# Patient Record
Sex: Female | Born: 1960 | Race: Black or African American | Hispanic: No | Marital: Single | Smoking: Never smoker
Health system: Southern US, Community
[De-identification: ages and names within clinical notes are randomized; demographics above are authoritative.]

## PROBLEM LIST (undated history)

## (undated) DIAGNOSIS — L730 Acne keloid: Secondary | ICD-10-CM

## (undated) DIAGNOSIS — J189 Pneumonia, unspecified organism: Secondary | ICD-10-CM

## (undated) DIAGNOSIS — I10 Essential (primary) hypertension: Secondary | ICD-10-CM

## (undated) DIAGNOSIS — E119 Type 2 diabetes mellitus without complications: Secondary | ICD-10-CM

## (undated) HISTORY — DX: Type 2 diabetes mellitus without complications: E11.9

## (undated) HISTORY — DX: Essential (primary) hypertension: I10

## (undated) HISTORY — DX: Pneumonia, unspecified organism: J18.9

---

## 1998-01-24 HISTORY — PX: OTHER SURGICAL HISTORY: SHX169

## 2004-06-30 ENCOUNTER — Ambulatory Visit: Payer: Self-pay | Admitting: Internal Medicine

## 2005-08-11 ENCOUNTER — Ambulatory Visit: Payer: Self-pay | Admitting: General Practice

## 2005-08-22 ENCOUNTER — Ambulatory Visit: Payer: Self-pay | Admitting: Internal Medicine

## 2010-04-06 ENCOUNTER — Emergency Department: Payer: Self-pay | Admitting: Unknown Physician Specialty

## 2011-05-05 LAB — HM DIABETES EYE EXAM

## 2011-10-22 ENCOUNTER — Emergency Department: Payer: Self-pay | Admitting: Emergency Medicine

## 2012-02-17 ENCOUNTER — Emergency Department: Payer: Self-pay | Admitting: Emergency Medicine

## 2012-02-17 LAB — CBC WITH DIFFERENTIAL/PLATELET
Basophil #: 0.1 10*3/uL (ref 0.0–0.1)
Eosinophil #: 0.1 10*3/uL (ref 0.0–0.7)
Eosinophil %: 2.6 %
HCT: 43 % (ref 35.0–47.0)
Lymphocyte #: 1.5 10*3/uL (ref 1.0–3.6)
MCV: 75 fL — ABNORMAL LOW (ref 80–100)
Monocyte %: 8.4 %
Neutrophil #: 3.2 10*3/uL (ref 1.4–6.5)
Platelet: 244 10*3/uL (ref 150–440)
RBC: 5.73 10*6/uL — ABNORMAL HIGH (ref 3.80–5.20)

## 2012-02-17 LAB — URINALYSIS, COMPLETE
Glucose,UR: >=500 mg/dL (ref 0–75)
Leukocyte Esterase: NEGATIVE
Nitrite: NEGATIVE
RBC,UR: <1 /HPF (ref 0–5)
Specific Gravity: 1.027 (ref 1.003–1.030)
Squamous Epithelial: 1
WBC UR: 5 /HPF (ref 0–5)

## 2012-02-17 LAB — COMPREHENSIVE METABOLIC PANEL
Anion Gap: 7 (ref 7–16)
BUN: 16 mg/dL (ref 7–18)
Calcium, Total: 9.9 mg/dL (ref 8.5–10.1)
Creatinine: 1.06 mg/dL (ref 0.60–1.30)
EGFR (Non-African Amer.): >60
Glucose: 488 mg/dL — ABNORMAL HIGH (ref 65–99)
Osmolality: 285 (ref 275–301)
Potassium: 5 mmol/L (ref 3.5–5.1)
SGOT(AST): 30 U/L (ref 15–37)
SGPT (ALT): 42 U/L (ref 12–78)
Sodium: 131 mmol/L — ABNORMAL LOW (ref 136–145)
Total Protein: 8 g/dL (ref 6.4–8.2)

## 2012-05-10 LAB — HM DIABETES EYE EXAM

## 2012-07-26 LAB — HM DIABETES EYE EXAM

## 2013-12-13 ENCOUNTER — Ambulatory Visit: Payer: Self-pay | Admitting: Nurse Practitioner

## 2014-05-16 ENCOUNTER — Observation Stay
Admit: 2014-05-16 | Disposition: A | Discharge status: Home / Self Care | Payer: Self-pay | Attending: Internal Medicine | Admitting: Internal Medicine

## 2014-05-16 LAB — CBC WITH DIFFERENTIAL/PLATELET
Basophil #: 0.1 10*3/uL (ref 0.0–0.1)
Basophil %: 0.9 %
EOS PCT: 1.6 %
Eosinophil #: 0.1 10*3/uL (ref 0.0–0.7)
HCT: 36.6 % (ref 35.0–47.0)
HGB: 12 g/dL (ref 12.0–16.0)
LYMPHS PCT: 17.1 %
Lymphocyte #: 1.4 10*3/uL (ref 1.0–3.6)
MCH: 24.9 pg — ABNORMAL LOW (ref 26.0–34.0)
MCHC: 32.8 g/dL (ref 32.0–36.0)
MCV: 76 fL — ABNORMAL LOW (ref 80–100)
MONO ABS: 0.6 x10 3/mm (ref 0.2–0.9)
Monocyte %: 7.6 %
NEUTROS ABS: 6.1 10*3/uL (ref 1.4–6.5)
NEUTROS PCT: 72.8 %
Platelet: 218 10*3/uL (ref 150–440)
RBC: 4.83 10*6/uL (ref 3.80–5.20)
RDW: 13 % (ref 11.5–14.5)
WBC: 8.4 10*3/uL (ref 3.6–11.0)

## 2014-05-16 LAB — CBC
HCT: 42.3 % (ref 35.0–47.0)
HGB: 13.8 g/dL (ref 12.0–16.0)
MCH: 24.7 pg — ABNORMAL LOW (ref 26.0–34.0)
MCHC: 32.7 g/dL (ref 32.0–36.0)
MCV: 76 fL — AB (ref 80–100)
Platelet: 272 10*3/uL (ref 150–440)
RBC: 5.59 10*6/uL — ABNORMAL HIGH (ref 3.80–5.20)
RDW: 13.1 % (ref 11.5–14.5)
WBC: 6.9 10*3/uL (ref 3.6–11.0)

## 2014-05-16 LAB — BASIC METABOLIC PANEL
ANION GAP: 10 (ref 7–16)
BUN: 19 mg/dL
CO2: 28 mmol/L
CREATININE: 0.93 mg/dL
Calcium, Total: 9.9 mg/dL
Chloride: 101 mmol/L
EGFR (African American): >60
EGFR (Non-African Amer.): >60
Glucose: 205 mg/dL — ABNORMAL HIGH
POTASSIUM: 3.4 mmol/L — AB
Sodium: 139 mmol/L

## 2014-05-16 LAB — TROPONIN I: Troponin-I: <0.03 ng/mL

## 2014-05-17 LAB — CBC WITH DIFFERENTIAL/PLATELET
BASOS PCT: 1.3 %
Basophil #: 0.1 10*3/uL (ref 0.0–0.1)
Eosinophil #: 0.1 10*3/uL (ref 0.0–0.7)
Eosinophil %: 1.6 %
HCT: 33.6 % — ABNORMAL LOW (ref 35.0–47.0)
HGB: 11.5 g/dL — AB (ref 12.0–16.0)
Lymphocyte #: 1.2 10*3/uL (ref 1.0–3.6)
Lymphocyte %: 17 %
MCH: 25.7 pg — ABNORMAL LOW (ref 26.0–34.0)
MCHC: 34.1 g/dL (ref 32.0–36.0)
MCV: 75 fL — ABNORMAL LOW (ref 80–100)
Monocyte #: 0.4 x10 3/mm (ref 0.2–0.9)
Monocyte %: 6.4 %
NEUTROS ABS: 5.2 10*3/uL (ref 1.4–6.5)
NEUTROS PCT: 73.7 %
Platelet: 208 10*3/uL (ref 150–440)
RBC: 4.46 10*6/uL (ref 3.80–5.20)
RDW: 12.8 % (ref 11.5–14.5)
WBC: 7 10*3/uL (ref 3.6–11.0)

## 2014-05-17 LAB — INFLUENZA A,B,H1N1 - PCR (ARMC)
H1N1 flu by pcr: NOT DETECTED
INFLBPCR: NEGATIVE
Influenza A By PCR: NEGATIVE

## 2014-05-17 LAB — BASIC METABOLIC PANEL
Anion Gap: 7 (ref 7–16)
BUN: 18 mg/dL
Calcium, Total: 8.7 mg/dL — ABNORMAL LOW
Chloride: 100 mmol/L — ABNORMAL LOW
Co2: 27 mmol/L
Creatinine: 0.89 mg/dL
EGFR (African American): >60
EGFR (Non-African Amer.): >60
Glucose: 284 mg/dL — ABNORMAL HIGH
Potassium: 3.9 mmol/L
Sodium: 134 mmol/L — ABNORMAL LOW

## 2014-05-17 LAB — HEMOGLOBIN: HGB: 11.5 g/dL — AB (ref 12.0–16.0)

## 2014-05-18 LAB — CBC WITH DIFFERENTIAL/PLATELET
BASOS ABS: 0.1 10*3/uL (ref 0.0–0.1)
Basophil %: 1 %
Eosinophil #: 0.3 10*3/uL (ref 0.0–0.7)
Eosinophil %: 5.1 %
HCT: 36.1 % (ref 35.0–47.0)
HGB: 11.7 g/dL — ABNORMAL LOW (ref 12.0–16.0)
Lymphocyte #: 1.5 10*3/uL (ref 1.0–3.6)
Lymphocyte %: 27.8 %
MCH: 24.5 pg — ABNORMAL LOW (ref 26.0–34.0)
MCHC: 32.4 g/dL (ref 32.0–36.0)
MCV: 76 fL — ABNORMAL LOW (ref 80–100)
MONOS PCT: 9.3 %
Monocyte #: 0.5 x10 3/mm (ref 0.2–0.9)
NEUTROS ABS: 3 10*3/uL (ref 1.4–6.5)
Neutrophil %: 56.8 %
PLATELETS: 239 10*3/uL (ref 150–440)
RBC: 4.79 10*6/uL (ref 3.80–5.20)
RDW: 12.8 % (ref 11.5–14.5)
WBC: 5.2 10*3/uL (ref 3.6–11.0)

## 2014-05-20 LAB — EXPECTORATED SPUTUM ASSESSMENT W REFEX TO RESP CULTURE

## 2014-05-21 LAB — CULTURE, BLOOD (SINGLE)

## 2014-05-25 DIAGNOSIS — J189 Pneumonia, unspecified organism: Secondary | ICD-10-CM

## 2014-05-25 HISTORY — DX: Pneumonia, unspecified organism: J18.9

## 2014-05-25 NOTE — Discharge Summary (Signed)
PATIENT NAME:  Cynthia Harper, BUFFONE MR#:  [id number] DATE OF BIRTH:  06-06-1960  DATE OF ADMISSION:  05/16/2014 DATE OF DISCHARGE:  05/18/2014  PRIMARY CARE PHYSICIAN:  [facility].  DISCHARGE DIAGNOSES:   Community-acquired pneumonia, right lower lobe; hemoptysis; hypertension; diabetes; hyperlipidemia.   CONDITION:  Stable.   CODE STATUS:  Full code.   HOME MEDICATIONS:  Please refer to the medication reconciliation list.  Aspirin was discontinued due to hemoptysis.   DIET:  Low-sodium, low-fat, low-cholesterol, ADA diet.   ACTIVITY:  As tolerated.   FOLLOWUP CARE:  Follow up with PCP within 1-2 weeks.   REASON FOR ADMISSION:  Coughing blood.   HOSPITAL COURSE:   1. The patient is a 54 year old African American female with a history of hypertension and diabetes.  Came to ED due to coughing blood with fever, chills for 5 days.  For detailed history and physical examination, please refer to the admission note dictated by Dr. Lavetta Nielsen.  Patient had a chest x-ray that showed right lower lobe pneumonia.  After admission, the patient has been treated with Levaquin IV.  Since patient had a lot of hemoptysis on the second day after admission, we monitored the patient's hemoglobin every 6 hours.  Patient's hemoglobin has been stable.  The last hemoglobin is 11.7 today.  The patient has no hemoptysis since yesterday afternoon.  Her symptoms are much better.  No fever or chills.  Vital signs are stable.  Physical examination is unremarkable.  2. For diabetes, the patient has been treated with sliding scale Lantus at her home dose.  3. Hypertension has been controlled.   The patient is clinically stable.  Will be discharged to home today.  Discussed the patient's discharge plan with the patient, the patient's family member, and nurse.   TIME SPENT:  About 36 minutes.    ____________________________ Demetrios Loll, MD qc:kc D: 05/18/2014 14:00:25 ET T: 05/18/2014 15:40:00  ET JOB#: [id number]  cc: Demetrios Loll, MD, <Dictator> Demetrios Loll MD ELECTRONICALLY SIGNED 05/19/2014 17:15

## 2014-05-25 NOTE — H&P (Signed)
PATIENT NAME:  Cynthia Harper, Cynthia Harper MR#:  [id number] DATE OF BIRTH:  Aug 04, 1960  DATE OF ADMISSION:  05/16/2014  REFERRING PHYSICIAN: Wells Guiles L. Reita Cliche, MD  PRIMARY CARE PHYSICIAN: [facility].  CHIEF COMPLAINT: Coughing up blood.   HISTORY OF PRESENT ILLNESS: A 54 year old [place] American female with a past medical history of type 2 diabetes, insulin-requiring; hypertension, essential; presenting with coughing up blood/hemoptysis. She describes a 5-day duration of cough with subjective fevers, chills, which turned into hemoptysis about 3 days ago of sputum streaked with blood to occasional frank blood clots. Saw PCP 3 days ago about worsening symptoms. She was diagnosed with "flu;" however, had no influenza swab that was performed. Given some cough medications with codeine, sent home. Given worsening symptoms, decided to present to the hospital for further workup and evaluation.  REVIEW OF SYSTEMS:  CONSTITUTIONAL: Positive for fevers, chills. Denies fatigue, weakness.  EYES: Denies blurred vision, double vision, eye pain. EARS, NOSE, THROAT: Denies tinnitus, ear pain, hearing loss. RESPIRATORY: Positive for cough and hemoptysis.  CARDIOVASCULAR: Denies chest pain, palpitations, edema.  GASTROINTESTINAL: Denies nausea, vomiting, diarrhea, or abdominal pain. GENITOURINARY: Denies dysuria or hematuria. ENDOCRINE: Denies nocturia or thyroid problems. HEMATOLOGIC AND LYMPHATIC: Denies easy bruising. Positive for bleeding as above. SKIN: Denies rash, lesion.  MUSCULOSKELETAL: Denies pain in neck, back, shoulder, knees, hips or arthritic symptoms.  NEUROLOGIC: Denies paralysis, paresthesias.  PSYCHIATRIC: Denies anxiety or depressive symptoms.  Otherwise, full review of systems performed by me was negative.   PAST MEDICAL HISTORY: Includes hypertension, essential; hyperlipidemia, unspecified; type 2 diabetes, insulin-requiring, however uncomplicated.   SOCIAL HISTORY: Denies any alcohol,  tobacco, or drug use.   FAMILY HISTORY: Positive for diabetes in multiple family members.   ALLERGIES: ASPIRIN.   HOME MEDICATIONS: Include aspirin 81 mg p.o. daily, lisinopril 20 mg p.o. b.i.d., Levemir 45 units b.i.d., metformin 500 mg p.o. b.i.d., NovoLog 5 units 3 times daily with meals, Tradjenta 5 mg p.o. daily, simvastatin 5 mg p.o. at bedtime, Norvasc 5 mg p.o. daily, hydrochlorothiazide 25 mg p.o. daily.   PHYSICAL EXAMINATION:  VITAL SIGNS: Temperature 98.2; heart rate on arrival 125, currently 96; respirations 24, currently 16; blood pressure 130/84; saturating 92% on room air; weight 86.2 kg.  GENERAL: Well-nourished, well-developed, African American female currently in no acute distress.  HEAD: Normocephalic, atraumatic.  EYES: Pupils equal, round, and reactive to light. Extraocular muscles intact. No scleral icterus.  MOUTH: Moist mucosal membranes. Dentition intact. No abscess noted.  EARS, NOSE, AND THROAT: Clear without exudates. No external lesions.  NECK: Supple. No thyromegaly. No nodules. No JVD.  PULMONARY: Greatly diminished breath sounds throughout all lung fields with some coarse rhonchi of right base. No wheezing noted. Poor air entry bilaterally.  CHEST: Nontender to palpation.  CARDIOVASCULAR: S1, S2, regular rate and rhythm. No murmurs, rubs, or gallops. No edema. Pedal pulses 2+ bilaterally. GASTROINTESTINAL: Soft, nontender, nondistended. No masses. Positive bowel sounds. No hepatosplenomegaly.  MUSCULOSKELETAL: No swelling, clubbing, or edema. Range of motion full in all extremities.  NEUROLOGIC: Cranial nerves II through XII intact. No gross focal neurologic deficits. Sensation intact. Reflexes intact.  SKIN: No ulceration, lesions, rashes, or cyanosis. Skin warm, dry. Turgor intact.  PSYCHIATRIC: Mood, affect are within normal limits. The patient is awake, alert, oriented x 3. Insight and judgment intact.   LABORATORY DATA: CT, chest performed. No evidence  of PE; however, there is some consolidation in the right lower lobe consistent with pneumonia. Remainder of laboratory data: Sodium 139, potassium 3.4, chloride  101, bicarbonate of 28, BUN 19, creatinine 0.93, glucose of 205. WBC of 6.9, hemoglobin 13.8, platelets of 272,000.   ASSESSMENT AND PLAN: A 54 year old [place] American female with a history of type 2 diabetes, insulin-requiring; hypertension, essential; presenting with hemoptysis.  1.  Community-acquired pneumonia. She has already received antibiotic coverage with Levaquin; we will continue this. Provide supplemental oxygen as required to keep oxygen saturation greater than 92%. DuoNeb treatments as well. Cultures were performed for culture data.  2.  Hemoptysis secondary to community-acquired pneumonia and coughing. Monitor CBCs every 6 hours. We will consult pulmonary critical-care medicine, as she may require bronchoscopy if condition worsens. No acute indication for one immediately now.  3.  Type 2 diabetes, insulin-requiring, uncomplicated. Hold p.o. agents. Add insulin sliding scale, q.6 hour Accu-Cheks. Continue with her basal Levemir. 4.  Hypertension, essential. Continue with Norvasc.  5.  Hyperlipidemia, unspecified. Continue statin therapy.  6.  Venous thromboembolism prophylaxis with sequential compression devices.  CODE STATUS: The patient is full code.   TIME SPENT: 35 minutes.   ____________________________ Aaron Mose. Hower, MD dkh:ST D: 05/16/2014 21:12:53 ET T: 05/16/2014 23:13:12 ET JOB#: [id number]  cc: Aaron Mose. Hower, MD, <Dictator> DAVID Woodfin Ganja MD ELECTRONICALLY SIGNED 05/18/2014 2:52

## 2014-07-17 ENCOUNTER — Other Ambulatory Visit: Payer: Self-pay | Admitting: Physician Assistant

## 2014-07-17 DIAGNOSIS — R011 Cardiac murmur, unspecified: Secondary | ICD-10-CM

## 2014-07-23 ENCOUNTER — Ambulatory Visit: Payer: Self-pay | Attending: Physician Assistant

## 2014-07-31 ENCOUNTER — Ambulatory Visit
Admission: RE | Admit: 2014-07-31 | Discharge: 2014-07-31 | Disposition: A | Discharge status: Home / Self Care | Payer: Managed Care, Other (non HMO) | Source: Ambulatory Visit | Attending: Physician Assistant | Admitting: Physician Assistant

## 2014-07-31 DIAGNOSIS — R011 Cardiac murmur, unspecified: Secondary | ICD-10-CM | POA: Diagnosis present

## 2014-07-31 NOTE — Progress Notes (Signed)
*  PRELIMINARY RESULTS* Echocardiogram 2D Echocardiogram has been performed.  Cynthia Harper 07/31/2014, 12:00 PM

## 2014-11-18 ENCOUNTER — Other Ambulatory Visit: Payer: Self-pay

## 2014-11-25 ENCOUNTER — Ambulatory Visit: Payer: Self-pay

## 2014-12-03 ENCOUNTER — Institutional Professional Consult (permissible substitution): Payer: Self-pay | Admitting: Specialist

## 2014-12-04 ENCOUNTER — Ambulatory Visit: Payer: Self-pay | Admitting: Ophthalmology

## 2014-12-11 ENCOUNTER — Ambulatory Visit: Payer: Self-pay | Admitting: Ophthalmology

## 2014-12-16 ENCOUNTER — Other Ambulatory Visit: Payer: Self-pay

## 2014-12-16 LAB — BASIC METABOLIC PANEL
BUN: 15 mg/dL (ref 4–21)
CREATININE: 0.8 mg/dL (ref 0.5–1.1)
Glucose: 271 mg/dL
POTASSIUM: 4.5 mmol/L (ref 3.4–5.3)
Sodium: 137 mmol/L (ref 137–147)

## 2014-12-16 LAB — LIPID PANEL
Cholesterol: 323 mg/dL — AB (ref 0–200)
HDL: 47 mg/dL (ref 35–70)
TRIGLYCERIDES: 454 mg/dL — AB (ref 40–160)

## 2014-12-16 LAB — TSH: TSH: 2.56 u[IU]/mL (ref 0.41–5.90)

## 2014-12-16 LAB — HEPATIC FUNCTION PANEL
ALK PHOS: 101 U/L (ref 25–125)
ALT: 36 U/L — AB (ref 7–35)
AST: 19 U/L (ref 13–35)
BILIRUBIN, TOTAL: 0.2 mg/dL

## 2014-12-16 LAB — CBC AND DIFFERENTIAL
HEMATOCRIT: 36 % (ref 36–46)
HEMOGLOBIN: 12 g/dL (ref 12.0–16.0)
Neutrophils Absolute: 3 /uL
Platelets: 242 10*3/uL (ref 150–399)
WBC: 5.7 10*3/mL

## 2014-12-16 LAB — HEMOGLOBIN A1C: HEMOGLOBIN A1C: 12.9

## 2014-12-24 ENCOUNTER — Institutional Professional Consult (permissible substitution): Payer: Self-pay | Admitting: Specialist

## 2014-12-24 ENCOUNTER — Ambulatory Visit
Admission: RE | Admit: 2014-12-24 | Discharge: 2014-12-24 | Disposition: A | Discharge status: Home / Self Care | Payer: Managed Care, Other (non HMO) | Source: Ambulatory Visit | Attending: Specialist | Admitting: Specialist

## 2014-12-24 ENCOUNTER — Other Ambulatory Visit: Payer: Self-pay | Admitting: Specialist

## 2014-12-24 ENCOUNTER — Ambulatory Visit
Admission: RE | Admit: 2014-12-24 | Discharge: 2014-12-24 | Disposition: A | Discharge status: Home / Self Care | Payer: Self-pay | Source: Ambulatory Visit | Attending: Specialist | Admitting: Specialist

## 2014-12-24 DIAGNOSIS — M5440 Lumbago with sciatica, unspecified side: Secondary | ICD-10-CM

## 2014-12-24 DIAGNOSIS — I70208 Unspecified atherosclerosis of native arteries of extremities, other extremity: Secondary | ICD-10-CM | POA: Insufficient documentation

## 2014-12-24 DIAGNOSIS — M25552 Pain in left hip: Secondary | ICD-10-CM

## 2014-12-24 DIAGNOSIS — M545 Low back pain: Secondary | ICD-10-CM | POA: Insufficient documentation

## 2014-12-25 ENCOUNTER — Ambulatory Visit: Payer: Self-pay | Admitting: Ophthalmology

## 2014-12-25 LAB — HM DIABETES EYE EXAM

## 2014-12-30 ENCOUNTER — Ambulatory Visit: Payer: Self-pay

## 2014-12-31 ENCOUNTER — Ambulatory Visit: Payer: Self-pay | Admitting: Specialist

## 2015-01-14 ENCOUNTER — Ambulatory Visit: Payer: Self-pay | Admitting: Specialist

## 2015-01-21 ENCOUNTER — Ambulatory Visit: Payer: Self-pay | Admitting: Specialist

## 2015-01-28 ENCOUNTER — Other Ambulatory Visit: Payer: Self-pay | Admitting: Specialist

## 2015-01-28 DIAGNOSIS — M5416 Radiculopathy, lumbar region: Secondary | ICD-10-CM

## 2015-02-10 ENCOUNTER — Ambulatory Visit: Payer: Self-pay

## 2015-02-18 ENCOUNTER — Ambulatory Visit
Admission: RE | Admit: 2015-02-18 | Discharge: 2015-02-18 | Disposition: A | Discharge status: Home / Self Care | Payer: Managed Care, Other (non HMO) | Source: Ambulatory Visit | Attending: Specialist | Admitting: Specialist

## 2015-02-18 DIAGNOSIS — D259 Leiomyoma of uterus, unspecified: Secondary | ICD-10-CM | POA: Insufficient documentation

## 2015-02-18 DIAGNOSIS — M5136 Other intervertebral disc degeneration, lumbar region: Secondary | ICD-10-CM | POA: Insufficient documentation

## 2015-02-18 DIAGNOSIS — M545 Low back pain: Secondary | ICD-10-CM | POA: Insufficient documentation

## 2015-02-18 DIAGNOSIS — M5416 Radiculopathy, lumbar region: Secondary | ICD-10-CM | POA: Insufficient documentation

## 2015-02-18 MED ORDER — GADOBENATE DIMEGLUMINE 529 MG/ML IV SOLN
20.0000 mL | Freq: Once | INTRAVENOUS | Status: AC | PRN
  Administered 2015-02-18: 18 mL via INTRAVENOUS

## 2015-02-20 LAB — POCT I-STAT CREATININE: Creatinine, Ser: 0.9 mg/dL (ref 0.44–1.00)

## 2015-02-20 LAB — BASIC METABOLIC PANEL: CREATININE: 0.9 mg/dL (ref <=1.1)

## 2015-02-25 ENCOUNTER — Ambulatory Visit: Payer: Self-pay | Admitting: Specialist

## 2015-03-10 DIAGNOSIS — M549 Dorsalgia, unspecified: Secondary | ICD-10-CM | POA: Insufficient documentation

## 2015-03-10 DIAGNOSIS — I10 Essential (primary) hypertension: Secondary | ICD-10-CM

## 2015-03-10 DIAGNOSIS — E119 Type 2 diabetes mellitus without complications: Secondary | ICD-10-CM | POA: Insufficient documentation

## 2015-03-10 DIAGNOSIS — M545 Low back pain: Secondary | ICD-10-CM

## 2015-03-31 ENCOUNTER — Encounter: Payer: Self-pay | Admitting: Internal Medicine

## 2015-03-31 ENCOUNTER — Ambulatory Visit: Payer: Self-pay | Admitting: Internal Medicine

## 2015-03-31 VITALS — BP 135/90 | HR 93 | Wt 194.0 lb

## 2015-03-31 DIAGNOSIS — E785 Hyperlipidemia, unspecified: Secondary | ICD-10-CM

## 2015-03-31 DIAGNOSIS — E119 Type 2 diabetes mellitus without complications: Secondary | ICD-10-CM

## 2015-03-31 DIAGNOSIS — I10 Essential (primary) hypertension: Secondary | ICD-10-CM

## 2015-03-31 DIAGNOSIS — R1900 Intra-abdominal and pelvic swelling, mass and lump, unspecified site: Secondary | ICD-10-CM

## 2015-03-31 DIAGNOSIS — M545 Low back pain: Secondary | ICD-10-CM

## 2015-03-31 MED ORDER — INSULIN ASPART 100 UNIT/ML FLEXPEN: 12.0000 [IU] | PEN_INJECTOR | Freq: Three times a day (TID) | SUBCUTANEOUS | Status: DC

## 2015-03-31 NOTE — Assessment & Plan Note (Signed)
Pelvic mass seen on MRI. Palpable mass on exam. Will get CT abdomen for better evaluation. Set up GYN evaluation.

## 2015-03-31 NOTE — Patient Instructions (Addendum)
It was great to see you tonight!  Labs today to check A1c.  We will set up a CT abdomen to evaluate your pelvic mass seen on MRI.  We will set up gynecology evaluation.  Follow up 4 weeks.

## 2015-03-31 NOTE — Assessment & Plan Note (Signed)
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 Reviewed MRI lumbar spine. Continue Mobic. Follow up with ortho.

## 2015-03-31 NOTE — Progress Notes (Signed)
Subjective:    Patient ID: Quay Burow, female    DOB: 02/28/1960, 55 y.o.   MRN: [id number]  HPI  55YO female presents for follow up.  Back pain - Aching pain with left sciatica. Seen by ortho. Had MRI lumbar spine which showed L4-5 DDD. Recommended PT. Taking Mobic with minimal improvement.  Pelvic mass - Seen on MRI lumbar spine. No menstrual bleeding for 1 year. Periods were very heavy in past. Had fibroidectomy at [facility] in 2002. Notes some constipation recently. Taking Laxatives at home. Last Colonoscopy in 1980s.   DM - BG over 400 here today. Reports compliance with medication. Trying to follow a healthy diet.  Wt Readings from Last 3 Encounters:  03/31/15 194 lb (87.998 kg)  12/30/14 196 lb (88.905 kg)  02/18/15 194 lb (87.998 kg)   BP Readings from Last 3 Encounters:  03/31/15 135/90  12/30/14 139/88    Past Medical History  Diagnosis Date  . Hypertension   . Diabetes mellitus without complication ([place])   . Pneumonia May 2016    Pt reports being hospitalized for pneumonia in May 2016.   History reviewed. No pertinent family history. Past Surgical History  Procedure Laterality Date  . Fibroid tumor removal  2000   Social History   Social History  . Marital Status: Single    Spouse Name: N/A  . Number of Children: N/A  . Years of Education: N/A   Social History Main Topics  . Smoking status: None  . Smokeless tobacco: None  . Alcohol Use: None  . Drug Use: None  . Sexual Activity: Not Asked   Other Topics Concern  . None   Social History Narrative    Review of Systems  Constitutional: Negative for fever, chills, appetite change, fatigue and unexpected weight change.  Eyes: Negative for visual disturbance.  Respiratory: Negative for shortness of breath.   Cardiovascular: Negative for chest pain and leg swelling.  Gastrointestinal: Negative for nausea, vomiting, abdominal pain, diarrhea and constipation.  Genitourinary: Negative for flank pain,  vaginal bleeding, vaginal discharge, vaginal pain and pelvic pain.  Skin: Negative for color change and rash.  Hematological: Negative for adenopathy. Does not bruise/bleed easily.  Psychiatric/Behavioral: Negative for dysphoric mood. The patient is not nervous/anxious.        Objective:    BP 135/90 mmHg  Pulse 93  Wt 194 lb (87.998 kg)  SpO2 99% Physical Exam  Constitutional: She is oriented to person, place, and time. She appears well-developed and well-nourished. No distress.  HENT:  Head: Normocephalic and atraumatic.  Right Ear: External ear normal.  Left Ear: External ear normal.  Nose: Nose normal.  Mouth/Throat: Oropharynx is clear and moist. No oropharyngeal exudate.  Eyes: Conjunctivae and EOM are normal. Pupils are equal, round, and reactive to light. Right eye exhibits no discharge.  Neck: Normal range of motion. Neck supple. No thyromegaly present.  Cardiovascular: Normal rate, regular rhythm, normal heart sounds and intact distal pulses.  Exam reveals no gallop and no friction rub.   No murmur heard. Pulmonary/Chest: Effort normal. No respiratory distress. She has no wheezes. She has no rales.  Abdominal: Soft. Bowel sounds are normal. She exhibits no distension and no mass. There is no tenderness. There is no rebound and no guarding.  Musculoskeletal: Normal range of motion. She exhibits no edema or tenderness.  Lymphadenopathy:    She has no cervical adenopathy.  Neurological: She is alert and oriented to person, place, and time. No cranial nerve  deficit. Coordination normal.  Skin: Skin is warm and dry. No rash noted. She is not diaphoretic. No erythema. No pallor.  Psychiatric: She has a normal mood and affect. Her behavior is normal. Judgment and thought content normal.          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 Reviewed MRI lumbar spine. Continue  Mobic. Follow up with ortho.       Diabetes ([place])    Poor control by report. A1c with labs. Continue Lantus and Novolog. Follow up in 4 weeks.      Relevant Medications   metFORMIN (GLUCOPHAGE) 500 MG tablet   linagliptin (TRADJENTA) 5 MG TABS tablet   insulin aspart (NOVOLOG FLEXPEN) 100 UNIT/ML FlexPen   Other Relevant Orders   Comprehensive metabolic panel   Hemoglobin A1c   Essential hypertension - Primary    BP Readings from Last 3 Encounters:  03/31/15 135/90  12/30/14 139/88   BP fairly well controlled. Renal function with labs.      Relevant Medications   amLODipine (NORVASC) 5 MG tablet   Hyperlipidemia    LFTs with labs. Continue Crestor.      Relevant Medications   amLODipine (NORVASC) 5 MG tablet   Pelvic mass    Pelvic mass seen on MRI. Palpable mass on exam. Will get CT abdomen for better evaluation. Set up GYN evaluation.      Relevant Orders   CT Abdomen Pelvis W Contrast   Ambulatory referral to Gynecology   CBC with Differential/Platelet       Return in about 4 weeks (around 04/28/2015) for Recheck.  Ronette Deter, MD Internal Medicine [facility]

## 2015-03-31 NOTE — Assessment & Plan Note (Signed)
BP Readings from Last 3 Encounters:  03/31/15 135/90  12/30/14 139/88   BP fairly well controlled. Renal function with labs.

## 2015-03-31 NOTE — Assessment & Plan Note (Signed)
LFTs with labs. Continue Crestor.

## 2015-03-31 NOTE — Assessment & Plan Note (Signed)
Poor control by report. A1c with labs. Continue Lantus and Novolog. Follow up in 4 weeks.

## 2015-04-01 LAB — COMPREHENSIVE METABOLIC PANEL
ALBUMIN: 4.4 g/dL (ref 3.5–5.5)
ALT: 63 IU/L — AB (ref 0–32)
AST: 31 IU/L (ref 0–40)
Albumin/Globulin Ratio: 1.6 (ref 1.1–2.5)
Alkaline Phosphatase: 107 IU/L (ref 39–117)
BUN / CREAT RATIO: 17 (ref 9–23)
BUN: 18 mg/dL (ref 6–24)
CALCIUM: 10.4 mg/dL — AB (ref 8.7–10.2)
CO2: 24 mmol/L (ref 18–29)
CREATININE: 1.05 mg/dL — AB (ref 0.57–1.00)
Chloride: 93 mmol/L — ABNORMAL LOW (ref 96–106)
GFR, EST AFRICAN AMERICAN: 71 mL/min/{1.73_m2} (ref >59)
GFR, EST NON AFRICAN AMERICAN: 61 mL/min/{1.73_m2} (ref >59)
GLUCOSE: 359 mg/dL — AB (ref 65–99)
Globulin, Total: 2.8 g/dL (ref 1.5–4.5)
Potassium: 5.1 mmol/L (ref 3.5–5.2)
Sodium: 138 mmol/L (ref 134–144)
TOTAL PROTEIN: 7.2 g/dL (ref 6.0–8.5)

## 2015-04-01 LAB — CBC WITH DIFFERENTIAL/PLATELET
BASOS: 1 %
Basophils Absolute: 0 10*3/uL (ref 0.0–0.2)
EOS (ABSOLUTE): 0.2 10*3/uL (ref 0.0–0.4)
EOS: 3 %
HEMATOCRIT: 40.4 % (ref 34.0–46.6)
Hemoglobin: 13.4 g/dL (ref 11.1–15.9)
IMMATURE GRANS (ABS): 0 10*3/uL (ref 0.0–0.1)
IMMATURE GRANULOCYTES: 0 %
LYMPHS: 31 %
Lymphocytes Absolute: 2.1 10*3/uL (ref 0.7–3.1)
MCH: 24.9 pg — ABNORMAL LOW (ref 26.6–33.0)
MCHC: 33.2 g/dL (ref 31.5–35.7)
MCV: 75 fL — AB (ref 79–97)
MONOCYTES: 6 %
MONOS ABS: 0.4 10*3/uL (ref 0.1–0.9)
NEUTROS PCT: 59 %
Neutrophils Absolute: 4.1 10*3/uL (ref 1.4–7.0)
PLATELETS: 259 10*3/uL (ref 150–379)
RBC: 5.38 x10E6/uL — AB (ref 3.77–5.28)
RDW: 15 % (ref 12.3–15.4)
WBC: 7 10*3/uL (ref 3.4–10.8)

## 2015-04-01 LAB — HEMOGLOBIN A1C
Est. average glucose Bld gHb Est-mCnc: 364 mg/dL
Hgb A1c MFr Bld: 14.3 % — ABNORMAL HIGH (ref 4.8–5.6)

## 2015-04-09 ENCOUNTER — Ambulatory Visit
Admission: RE | Admit: 2015-04-09 | Discharge: 2015-04-09 | Disposition: A | Discharge status: Home / Self Care | Payer: Managed Care, Other (non HMO) | Source: Ambulatory Visit | Attending: Internal Medicine | Admitting: Internal Medicine

## 2015-04-09 DIAGNOSIS — K5730 Diverticulosis of large intestine without perforation or abscess without bleeding: Secondary | ICD-10-CM | POA: Insufficient documentation

## 2015-04-09 DIAGNOSIS — D259 Leiomyoma of uterus, unspecified: Secondary | ICD-10-CM | POA: Insufficient documentation

## 2015-04-09 DIAGNOSIS — R1900 Intra-abdominal and pelvic swelling, mass and lump, unspecified site: Secondary | ICD-10-CM | POA: Insufficient documentation

## 2015-04-09 DIAGNOSIS — N281 Cyst of kidney, acquired: Secondary | ICD-10-CM | POA: Insufficient documentation

## 2015-04-09 MED ORDER — IOHEXOL 300 MG/ML  SOLN
100.0000 mL | Freq: Once | INTRAMUSCULAR | Status: AC | PRN
  Administered 2015-04-09: 100 mL via INTRAVENOUS

## 2015-04-20 ENCOUNTER — Encounter (INDEPENDENT_AMBULATORY_CARE_PROVIDER_SITE_OTHER): Payer: Self-pay

## 2015-04-20 ENCOUNTER — Ambulatory Visit: Payer: Managed Care, Other (non HMO)

## 2015-04-28 ENCOUNTER — Ambulatory Visit: Payer: Self-pay | Admitting: Urology

## 2015-04-28 ENCOUNTER — Encounter: Payer: Self-pay | Admitting: Obstetrics & Gynecology

## 2015-04-28 ENCOUNTER — Other Ambulatory Visit: Payer: Self-pay | Admitting: Obstetrics & Gynecology

## 2015-04-28 ENCOUNTER — Ambulatory Visit: Payer: Self-pay | Admitting: Obstetrics & Gynecology

## 2015-04-28 VITALS — BP 122/82

## 2015-04-28 DIAGNOSIS — E119 Type 2 diabetes mellitus without complications: Secondary | ICD-10-CM

## 2015-04-28 DIAGNOSIS — Z01419 Encounter for gynecological examination (general) (routine) without abnormal findings: Secondary | ICD-10-CM

## 2015-04-28 MED ORDER — INSULIN ASPART 100 UNIT/ML FLEXPEN: 12.0000 [IU] | PEN_INJECTOR | Freq: Three times a day (TID) | SUBCUTANEOUS | Status: DC

## 2015-04-28 MED ORDER — ROSUVASTATIN CALCIUM 5 MG PO TABS: 5.0000 mg | ORAL_TABLET | Freq: Every day | ORAL | Status: DC

## 2015-04-28 MED ORDER — METFORMIN HCL 500 MG PO TABS: 500.0000 mg | ORAL_TABLET | Freq: Two times a day (BID) | ORAL | Status: DC

## 2015-04-28 MED ORDER — LINAGLIPTIN 5 MG PO TABS: 5.0000 mg | ORAL_TABLET | Freq: Every day | ORAL | Status: DC

## 2015-04-28 MED ORDER — ALBUTEROL SULFATE HFA 108 (90 BASE) MCG/ACT IN AERS: 2.0000 | INHALATION_SPRAY | Freq: Four times a day (QID) | RESPIRATORY_TRACT | Status: DC | PRN

## 2015-04-28 MED ORDER — AMLODIPINE BESYLATE 5 MG PO TABS: 5.0000 mg | ORAL_TABLET | Freq: Every day | ORAL | Status: DC

## 2015-04-28 MED ORDER — INSULIN ASPART 100 UNIT/ML FLEXPEN: 13.0000 [IU] | PEN_INJECTOR | Freq: Three times a day (TID) | SUBCUTANEOUS | Status: DC

## 2015-04-28 NOTE — Progress Notes (Signed)
       Patient: Cynthia Harper Female    DOB: 1960-02-18   55 y.o.   MRN: [id number] Visit Date: 04/28/2015  Today's Provider: [facility]   No chief complaint on file.  Subjective:    HPI Patient with uncontrolled diabetes.  Her last Hgb A1c was 14.3 %.  She is only eating twice daily.  Metformin 500 mg twice daily.  She is sometimes not taking her afternoon Novolog.  She did not bring her log book with her at this appointment.     No Known Allergies Previous Medications   ASPIRIN 81 MG TABLET    Take 81 mg by mouth daily.   GABAPENTIN (NEURONTIN) 100 MG CAPSULE    Take 100 mg by mouth 3 (three) times daily.   HYDROCHLOROTHIAZIDE (HYDRODIURIL) 25 MG TABLET    Take 25 mg by mouth daily.   INSULIN DETEMIR (LEVEMIR) 100 UNIT/ML INJECTION    Inject 50 Units into the skin 2 (two) times daily.    LISINOPRIL (PRINIVIL,ZESTRIL) 20 MG TABLET    Take 20 mg by mouth 2 (two) times daily.   MELOXICAM (MOBIC) 15 MG TABLET    Take 15 mg by mouth daily as needed.     Review of Systems  Social History  Substance Use Topics  . Smoking status: Never Smoker   . Smokeless tobacco: Never Used  . Alcohol Use: No   Objective:   LMP  (Approximate)  Physical Exam      Assessment & Plan:    DM  -increase the Novolog from 12 U three times daily to 13 U three times daily  -educated patient on the importance of eating four times daily  -RTC in one month with log book       [place]

## 2015-04-28 NOTE — Progress Notes (Signed)
   Subjective:    Patient ID: Cynthia Harper, female    DOB: 04/16/1960, 55 y.o.   MRN: [id number]  HPI The pt is a 55 y/o [name] with PMHx of uterine fibroids who presents to the clinic today c/o gradual onset of persistent lower back pain for several months. She has 11cm uterine fibroid observed upon MR in January 2017. Denies current vaginal bleeding and urinary sx, but does report hot flashes. She states that she had surgery in 2000 to remove uterine fibroids, prompted by uterine swelling. Her last pap smear was "a while ago" and she is menopausal, no bleeding in 1.5 years. FHx of mother with endometrial cancer, deceased at 53 y/o. No other medical concerns or complaints reported at this time.     Review of Systems  Gastrointestinal: Negative for abdominal pain.  Genitourinary: Negative for dysuria, frequency, hematuria, vaginal bleeding, difficulty urinating and menstrual problem.  Musculoskeletal: Positive for back pain.       Objective:   Physical Exam  Constitutional: She appears well-developed and well-nourished.  HENT:  Head: Normocephalic and atraumatic.  Abdominal: Soft. She exhibits no distension. There is no tenderness.  Genitourinary: Vagina normal. Uterus is enlarged and fixed. Cervix exhibits no motion tenderness. Right adnexum displays no mass and no tenderness. Left adnexum displays no mass and no tenderness.  Chaperone present Eli Hose, PA-S)  Exam limited due to obesity and narrow vagina Uterus enlarged and tilted back towards spine No adnexal mass          Assessment & Plan:  G0 AA F with Low Back Pain, incidental finding on MR of 11cm fibroid.  This may be cause contributing to pain, or may not.  Due to no bleeding, rec monitoring of growth and for future sx's.  Certainly there is  Also risk for uterine cancer, with FH, Obesity, no pregnancies.  May be causing back pain, but likely musculoskeletal contribution Do not foresee surgery at this  time Will want to watch for growth and bleeding Advised patient to report back with any bleeding or pain  PAP done

## 2015-05-01 LAB — PAP LB, RFX HPV ASCU: PAP SMEAR COMMENT: 0

## 2015-05-26 ENCOUNTER — Ambulatory Visit: Payer: Self-pay

## 2015-06-04 ENCOUNTER — Ambulatory Visit: Payer: Self-pay | Admitting: Nurse Practitioner

## 2015-06-04 VITALS — HR 113 | Ht 60.0 in | Wt 196.0 lb

## 2015-06-04 DIAGNOSIS — M545 Low back pain: Secondary | ICD-10-CM

## 2015-06-04 DIAGNOSIS — Z794 Long term (current) use of insulin: Secondary | ICD-10-CM

## 2015-06-04 DIAGNOSIS — E1121 Type 2 diabetes mellitus with diabetic nephropathy: Secondary | ICD-10-CM

## 2015-06-04 DIAGNOSIS — E0801 Diabetes mellitus due to underlying condition with hyperosmolarity with coma: Secondary | ICD-10-CM

## 2015-06-04 MED ORDER — CYCLOBENZAPRINE HCL 10 MG PO TABS: 10.0000 mg | ORAL_TABLET | Freq: Three times a day (TID) | ORAL | Status: DC

## 2015-06-04 NOTE — Progress Notes (Signed)
Here today for Lower back pain, MRI showed facet hypertrophy.   Pain primarily at lower lumbar spine, with radiation down L lateral leg.     Severe diabetic, last a1c at 14.3, avg daily blood sugar is 364, tonight is 389.    Assess/plan  Lumbar spine pain with facet hypertrophy Will add cyclobenazprine 10 mg tid, continue gabapentin, and meloxicam, have instructed no more than 1 15 mng tablet day, as will greatly incraeas risk of kidney damage.  Because of severe diabetes, will avoid a prednisone taper.    Pt to see our orthopedist asap  Can we get PT for her  DM needs to see endo  Would like a dietary log within the next two weeks to assess her intake as she acknowledges food indiscretions.

## 2015-06-25 ENCOUNTER — Ambulatory Visit: Payer: Self-pay | Admitting: Ophthalmology

## 2015-06-30 ENCOUNTER — Ambulatory Visit: Payer: Self-pay

## 2015-07-06 ENCOUNTER — Encounter: Payer: Self-pay | Admitting: Emergency Medicine

## 2015-07-06 ENCOUNTER — Emergency Department
Admission: EM | Admit: 2015-07-06 | Discharge: 2015-07-06 | Disposition: A | Discharge status: Home / Self Care | Payer: Managed Care, Other (non HMO) | Attending: Emergency Medicine | Admitting: Emergency Medicine

## 2015-07-06 DIAGNOSIS — E119 Type 2 diabetes mellitus without complications: Secondary | ICD-10-CM | POA: Insufficient documentation

## 2015-07-06 DIAGNOSIS — M4692 Unspecified inflammatory spondylopathy, cervical region: Secondary | ICD-10-CM | POA: Insufficient documentation

## 2015-07-06 DIAGNOSIS — Z7982 Long term (current) use of aspirin: Secondary | ICD-10-CM | POA: Insufficient documentation

## 2015-07-06 DIAGNOSIS — Z79899 Other long term (current) drug therapy: Secondary | ICD-10-CM | POA: Insufficient documentation

## 2015-07-06 DIAGNOSIS — Z7984 Long term (current) use of oral hypoglycemic drugs: Secondary | ICD-10-CM | POA: Insufficient documentation

## 2015-07-06 DIAGNOSIS — Z791 Long term (current) use of non-steroidal anti-inflammatories (NSAID): Secondary | ICD-10-CM | POA: Insufficient documentation

## 2015-07-06 DIAGNOSIS — Z794 Long term (current) use of insulin: Secondary | ICD-10-CM | POA: Insufficient documentation

## 2015-07-06 DIAGNOSIS — M47817 Spondylosis without myelopathy or radiculopathy, lumbosacral region: Secondary | ICD-10-CM

## 2015-07-06 DIAGNOSIS — G8929 Other chronic pain: Secondary | ICD-10-CM

## 2015-07-06 DIAGNOSIS — E785 Hyperlipidemia, unspecified: Secondary | ICD-10-CM | POA: Insufficient documentation

## 2015-07-06 DIAGNOSIS — I10 Essential (primary) hypertension: Secondary | ICD-10-CM | POA: Insufficient documentation

## 2015-07-06 DIAGNOSIS — M549 Dorsalgia, unspecified: Secondary | ICD-10-CM

## 2015-07-06 MED ORDER — HYDROMORPHONE HCL 1 MG/ML IJ SOLN
1.0000 mg | Freq: Once | INTRAMUSCULAR | Status: AC
Start: 2015-07-06 — End: 2015-07-06
  Administered 2015-07-06: 1 mg via INTRAMUSCULAR
  Filled 2015-07-06: qty 1

## 2015-07-06 MED ORDER — METHOCARBAMOL 500 MG PO TABS
1000.0000 mg | ORAL_TABLET | Freq: Once | ORAL | Status: AC
  Administered 2015-07-06: 1000 mg via ORAL
  Filled 2015-07-06: qty 2

## 2015-07-06 MED ORDER — METHOCARBAMOL 750 MG PO TABS: 750.0000 mg | ORAL_TABLET | Freq: Four times a day (QID) | ORAL | Status: DC

## 2015-07-06 MED ORDER — OXYCODONE-ACETAMINOPHEN 5-325 MG PO TABS: 1.0000 | ORAL_TABLET | ORAL | Status: DC | PRN

## 2015-07-06 MED ORDER — METHOCARBAMOL 1000 MG/10ML IJ SOLN
1000.0000 mg | Freq: Once | INTRAMUSCULAR | Status: DC
  Filled 2015-07-06: qty 10

## 2015-07-06 NOTE — ED Provider Notes (Signed)
[facility] Emergency Department Provider Note   ____________________________________________  Time seen: Approximately 4:35 PM  I have reviewed the triage vital signs and the nursing notes.   HISTORY  Chief Complaint Back Pain    HPI Cynthia Harper is a 55 y.o. female patient complaining of left low back pain. Patient stated this been a continued problem fall from a year. Patient stated MRI revealed Facet disease as source of pain. No palliative measures except for meloxicam for this complaint. Patient denies any radicular component to this pain. Patient denies any bladder or bowel dysfunction. Patient is rating her pain as a 10 over 10. Patient describes the pain as sharp. Past Medical History  Diagnosis Date  . Hypertension   . Diabetes mellitus without complication ([place])   . Pneumonia May 2016    Pt reports being hospitalized for pneumonia in May 2016.    Patient Active Problem List   Diagnosis Date Noted  . Hyperlipidemia 03/31/2015  . Pelvic mass 03/31/2015  . Diabetes ([place]) 03/10/2015  . Essential hypertension 03/10/2015  . Back pain 03/10/2015    Past Surgical History  Procedure Laterality Date  . Fibroid tumor removal  2000    Current Outpatient Rx  Name  Route  Sig  Dispense  Refill  . albuterol (PROVENTIL HFA;VENTOLIN HFA) 108 (90 Base) MCG/ACT inhaler   Inhalation   Inhale 2 puffs into the lungs every 6 (six) hours as needed for wheezing or shortness of breath.   1 Inhaler   3   . amLODipine (NORVASC) 5 MG tablet   Oral   Take 1 tablet (5 mg total) by mouth daily. Reported on 04/28/2015   90 tablet   3   . aspirin 81 MG tablet   Oral   Take 81 mg by mouth daily.         . cyclobenzaprine (FLEXERIL) 10 MG tablet   Oral   Take 1 tablet (10 mg total) by mouth 3 (three) times daily.   90 tablet   0   . gabapentin (NEURONTIN) 100 MG capsule   Oral   Take 100 mg by mouth 3 (three) times daily.         .  hydrochlorothiazide (HYDRODIURIL) 25 MG tablet   Oral   Take 25 mg by mouth daily.         . insulin aspart (NOVOLOG FLEXPEN) 100 UNIT/ML FlexPen   Subcutaneous   Inject 13 Units into the skin 3 (three) times daily with meals.   15 mL   11   . insulin aspart (NOVOLOG FLEXPEN) 100 UNIT/ML FlexPen   Subcutaneous   Inject 12 Units into the skin 3 (three) times daily with meals.   15 mL   11   . insulin detemir (LEVEMIR) 100 UNIT/ML injection   Subcutaneous   Inject 50 Units into the skin 2 (two) times daily.          Marland Kitchen linagliptin (TRADJENTA) 5 MG TABS tablet   Oral   Take 1 tablet (5 mg total) by mouth daily.   90 tablet   3   . lisinopril (PRINIVIL,ZESTRIL) 20 MG tablet   Oral   Take 20 mg by mouth 2 (two) times daily.         . meloxicam (MOBIC) 15 MG tablet   Oral   Take 15 mg by mouth daily as needed.          . metFORMIN (GLUCOPHAGE) 500 MG tablet  Oral   Take 1 tablet (500 mg total) by mouth 2 (two) times daily with a meal. Reported on 04/28/2015   180 tablet   3   . methocarbamol (ROBAXIN-750) 750 MG tablet   Oral   Take 1 tablet (750 mg total) by mouth 4 (four) times daily.   20 tablet   0   . oxyCODONE-acetaminophen (ROXICET) 5-325 MG tablet   Oral   Take 1 tablet by mouth every 4 (four) hours as needed for severe pain.   6 tablet   0   . rosuvastatin (CRESTOR) 5 MG tablet   Oral   Take 1 tablet (5 mg total) by mouth daily.   90 tablet   3     Allergies Review of patient's allergies indicates no known allergies.  Family History  Problem Relation Age of Onset  . Diabetes Mother   . Hypertension Mother   . Cancer Mother   . Diabetes Father   . Hypertension Father   . Diabetes Brother     Social History Social History  Substance Use Topics  . Smoking status: Never Smoker   . Smokeless tobacco: Never Used  . Alcohol Use: No    Review of Systems Constitutional: No fever/chills Eyes: No visual changes. ENT: No sore  throat. Cardiovascular: Denies chest pain. Respiratory: Denies shortness of breath. Gastrointestinal: No abdominal pain.  No nausea, no vomiting.  No diarrhea.  No constipation. Genitourinary: Negative for dysuria. Uterine fibroid mass Musculoskeletal: Negative for back pain. Skin: Negative for rash. Neurological: Negative for headaches, focal weakness or numbness. Endocrine:Diabetes, hyperlipidemia and hypertension.  ____________________________________________   PHYSICAL EXAM:  VITAL SIGNS: ED Triage Vitals  Enc Vitals Group     BP 07/06/15 1605 147/85 mmHg     Pulse Rate 07/06/15 1605 95     Resp 07/06/15 1605 16     Temp 07/06/15 1605 98.2 F (36.8 C)     Temp Source 07/06/15 1605 Oral     SpO2 07/06/15 1605 98 %     Weight 07/06/15 1605 196 lb (88.905 kg)     Height 07/06/15 1605 5' (1.524 m)     Head Cir --      Peak Flow --      Pain Score 07/06/15 1606 10     Pain Loc --      Pain Edu? --      Excl. in [place]? --     Constitutional: Alert and oriented. Well appearing and in no acute distress. Eyes: Conjunctivae are normal. PERRL. EOMI. Head: Atraumatic. Nose: No congestion/rhinnorhea. Mouth/Throat: Mucous membranes are moist.  Oropharynx non-erythematous. Neck: No stridor.  No cervical spine tenderness to palpation. Hematological/Lymphatic/Immunilogical: No cervical lymphadenopathy. Cardiovascular: Normal rate, regular rhythm. Grossly normal heart sounds.  Good peripheral circulation. Respiratory: Normal respiratory effort.  No retractions. Lungs CTAB. Gastrointestinal: Soft and nontender. No distention. No abdominal bruits. No CVA tenderness. Musculoskeletal:No obvious deformity of the lumbar spine. Patient has some moderate guarding palpation L4-S1.  Neurologic:  Normal speech and language. No gross focal neurologic deficits are appreciated. No gait instability. Skin:  Skin is warm, dry and intact. No rash noted. Psychiatric: Mood and affect are normal. Speech  and behavior are normal.  ____________________________________________   LABS (all labs ordered are listed, but only abnormal results are displayed)  Labs Reviewed - No data to display ____________________________________________  EKG   ____________________________________________  RADIOLOGY   ____________________________________________   PROCEDURES  Procedure(s) performed: None  Critical Care performed: No  ____________________________________________  INITIAL IMPRESSION / ASSESSMENT AND PLAN / ED COURSE  Pertinent labs & imaging results that were available during my care of the patient were reviewed by me and considered in my medical decision making (see chart for details).  Chronic back pain secondary to facet disease. Patient given discharge care instructions. Patient advised to follow-up with orthopedics for definitive evaluation and treatment. Patient given prescription for Percocets and Robaxin. ____________________________________________   FINAL CLINICAL IMPRESSION(S) / ED DIAGNOSES  Final diagnoses:  Chronic back pain greater than 3 months duration  Facet joint disease of lumbosacral region      NEW MEDICATIONS STARTED DURING THIS VISIT:  New Prescriptions   METHOCARBAMOL (ROBAXIN-750) 750 MG TABLET    Take 1 tablet (750 mg total) by mouth 4 (four) times daily.   OXYCODONE-ACETAMINOPHEN (ROXICET) 5-325 MG TABLET    Take 1 tablet by mouth every 4 (four) hours as needed for severe pain.     Note:  This document was prepared using Dragon voice recognition software and may include unintentional dictation errors.    Sable Feil, PA-C 07/06/15 1643  Carrie Mew, MD 07/07/15 2308

## 2015-07-06 NOTE — ED Notes (Signed)
Pt c/o pain in lower back that is increasingly worse. She had MRI and CT scan a month or so ago and they gave her gabapentin. She states that it is worse since then.

## 2015-07-06 NOTE — ED Notes (Signed)
Says pain in left low back.  Worse.  Has had this and has had mri [facility]. Pt cannot sit for long in trige.  Says left leg pins and needles.

## 2015-07-12 ENCOUNTER — Other Ambulatory Visit: Payer: Self-pay | Admitting: Nurse Practitioner

## 2015-07-12 ENCOUNTER — Other Ambulatory Visit: Payer: Self-pay | Admitting: Physician Assistant

## 2015-07-13 MED ORDER — CYCLOBENZAPRINE HCL 10 MG PO TABS: 10.0000 mg | ORAL_TABLET | Freq: Three times a day (TID) | ORAL | Status: DC

## 2015-07-16 ENCOUNTER — Other Ambulatory Visit: Payer: Self-pay

## 2015-07-16 DIAGNOSIS — Z794 Long term (current) use of insulin: Secondary | ICD-10-CM

## 2015-07-16 DIAGNOSIS — E1121 Type 2 diabetes mellitus with diabetic nephropathy: Secondary | ICD-10-CM

## 2015-07-16 DIAGNOSIS — M25552 Pain in left hip: Secondary | ICD-10-CM

## 2015-07-17 LAB — COMPREHENSIVE METABOLIC PANEL
ALBUMIN: 4.5 g/dL (ref 3.5–5.5)
ALK PHOS: 80 IU/L (ref 39–117)
ALT: 32 IU/L (ref 0–32)
AST: 25 IU/L (ref 0–40)
Albumin/Globulin Ratio: 1.7 (ref 1.2–2.2)
BUN / CREAT RATIO: 18 (ref 9–23)
BUN: 19 mg/dL (ref 6–24)
Bilirubin Total: <0.2 mg/dL (ref 0.0–1.2)
CHLORIDE: 97 mmol/L (ref 96–106)
CO2: 26 mmol/L (ref 18–29)
CREATININE: 1.06 mg/dL — AB (ref 0.57–1.00)
Calcium: 10.5 mg/dL — ABNORMAL HIGH (ref 8.7–10.2)
GFR calc Af Amer: 70 mL/min/{1.73_m2} (ref >59)
GFR calc non Af Amer: 61 mL/min/{1.73_m2} (ref >59)
GLUCOSE: 221 mg/dL — AB (ref 65–99)
Globulin, Total: 2.6 g/dL (ref 1.5–4.5)
POTASSIUM: 5.1 mmol/L (ref 3.5–5.2)
Sodium: 140 mmol/L (ref 134–144)
Total Protein: 7.1 g/dL (ref 6.0–8.5)

## 2015-07-17 LAB — HEMOGLOBIN A1C
ESTIMATED AVERAGE GLUCOSE: 280 mg/dL
Hgb A1c MFr Bld: 11.4 % — ABNORMAL HIGH (ref 4.8–5.6)

## 2015-07-23 ENCOUNTER — Ambulatory Visit: Payer: Self-pay | Admitting: Urology

## 2015-07-23 VITALS — BP 126/83 | HR 104 | Wt 192.0 lb

## 2015-07-23 DIAGNOSIS — G2581 Restless legs syndrome: Secondary | ICD-10-CM

## 2015-07-23 LAB — GLUCOSE, CAPILLARY: POC Glucose: 364 mg/dl — AB (ref 70–99)

## 2015-07-23 NOTE — Progress Notes (Signed)
Here today for Lower back pain, MRI showed facet hypertrophy.   Pain primarily at lower lumbar spine, with radiation down L lateral leg.   Pain has improved.  Gabapentin 300 mg is working.     Severe diabetic, last a1c at 11.4, avg daily blood sugar is 280, tonight is 364.   Physical exam Constitutional: Well nourished. Alert and oriented, No acute distress. HEENT: [place] AT, moist mucus membranes. Trachea midline, no masses. Cardiovascular: No clubbing, cyanosis, or edema. Systolic murmur.   Respiratory: Normal respiratory effort, no increased work of breathing. GI: Abdomen is soft, non tender, non distended, no abdominal masses.  Skin: No rashes, bruises or suspicious lesions. Lymph: No cervical or inguinal adenopathy. Neurologic: Grossly intact, no focal deficits, moving all 4 extremities. Psychiatric: Normal mood and affect.  Assess/plan  Lumbar spine pain with facet hypertrophy Will add cyclobenazprine 10 mg tid, continue gabapentin, and meloxicam, have instructed no more than 1 15 mng tablet day, as will greatly incraeas risk of kidney damage.  Because of severe diabetes, will avoid a prednisone taper.    Pt to see our orthopedist asap  Can we get PT for her  DM needs to see endo  Would like a dietary log within the next two weeks to assess her intake as she acknowledges food indiscretions.  Did not bring log book.  Reviewing her meter, she runs from 400 to 120.  No dates or times are in the meter.  Levemir just received today.

## 2015-07-24 LAB — FERRITIN: Ferritin: 78 ng/mL (ref 15–150)

## 2015-08-24 ENCOUNTER — Other Ambulatory Visit: Payer: Self-pay | Admitting: Orthopedic Surgery

## 2015-08-24 ENCOUNTER — Ambulatory Visit
Admission: RE | Admit: 2015-08-24 | Discharge: 2015-08-24 | Disposition: A | Discharge status: Home / Self Care | Payer: Disability Insurance | Source: Ambulatory Visit | Attending: Orthopedic Surgery | Admitting: Orthopedic Surgery

## 2015-08-24 DIAGNOSIS — M25572 Pain in left ankle and joints of left foot: Secondary | ICD-10-CM | POA: Diagnosis not present

## 2015-08-24 DIAGNOSIS — M7651 Patellar tendinitis, right knee: Secondary | ICD-10-CM | POA: Insufficient documentation

## 2015-08-24 DIAGNOSIS — M25562 Pain in left knee: Secondary | ICD-10-CM | POA: Insufficient documentation

## 2015-08-24 DIAGNOSIS — M25561 Pain in right knee: Secondary | ICD-10-CM

## 2015-08-24 DIAGNOSIS — M7652 Patellar tendinitis, left knee: Secondary | ICD-10-CM | POA: Insufficient documentation

## 2015-09-10 ENCOUNTER — Encounter: Payer: Self-pay | Admitting: Emergency Medicine

## 2015-09-10 ENCOUNTER — Emergency Department
Admission: EM | Admit: 2015-09-10 | Discharge: 2015-09-10 | Disposition: A | Discharge status: Home / Self Care | Payer: Disability Insurance | Attending: Emergency Medicine | Admitting: Emergency Medicine

## 2015-09-10 DIAGNOSIS — Z79899 Other long term (current) drug therapy: Secondary | ICD-10-CM | POA: Insufficient documentation

## 2015-09-10 DIAGNOSIS — I10 Essential (primary) hypertension: Secondary | ICD-10-CM | POA: Insufficient documentation

## 2015-09-10 DIAGNOSIS — L02811 Cutaneous abscess of head [any part, except face]: Secondary | ICD-10-CM | POA: Insufficient documentation

## 2015-09-10 DIAGNOSIS — Z7984 Long term (current) use of oral hypoglycemic drugs: Secondary | ICD-10-CM | POA: Insufficient documentation

## 2015-09-10 DIAGNOSIS — Z794 Long term (current) use of insulin: Secondary | ICD-10-CM | POA: Insufficient documentation

## 2015-09-10 DIAGNOSIS — E119 Type 2 diabetes mellitus without complications: Secondary | ICD-10-CM | POA: Insufficient documentation

## 2015-09-10 DIAGNOSIS — Z7982 Long term (current) use of aspirin: Secondary | ICD-10-CM | POA: Insufficient documentation

## 2015-09-10 HISTORY — DX: Acne keloid: L73.0

## 2015-09-10 MED ORDER — LIDOCAINE-EPINEPHRINE (PF) 1 %-1:200000 IJ SOLN
10.0000 mL | Freq: Once | INTRAMUSCULAR | Status: AC
  Administered 2015-09-10: 10 mL
  Filled 2015-09-10: qty 30

## 2015-09-10 MED ORDER — SULFAMETHOXAZOLE-TRIMETHOPRIM 800-160 MG PO TABS: 1.0000 | ORAL_TABLET | Freq: Two times a day (BID) | ORAL | 0 refills | Status: DC

## 2015-09-10 MED ORDER — KETOROLAC TROMETHAMINE 30 MG/ML IJ SOLN
30.0000 mg | Freq: Once | INTRAMUSCULAR | Status: AC
  Administered 2015-09-10: 30 mg via INTRAMUSCULAR
  Filled 2015-09-10: qty 1

## 2015-09-10 MED ORDER — HYDROCODONE-ACETAMINOPHEN 5-325 MG PO TABS: 1.0000 | ORAL_TABLET | Freq: Four times a day (QID) | ORAL | 0 refills | Status: DC | PRN

## 2015-09-10 MED ORDER — CEPHALEXIN 500 MG PO CAPS: 500.0000 mg | ORAL_CAPSULE | Freq: Three times a day (TID) | ORAL | 0 refills | Status: DC

## 2015-09-10 NOTE — ED Notes (Signed)
See triage note, pt sts she has tried to drain cyst herself w/ no success, painful to palpation.  Pt denies CP, SOB, n/v/d, LOC or dizziness.  NAD.

## 2015-09-10 NOTE — ED Triage Notes (Signed)
C/O boil to back of head x 1 month.  Patient states it drained, mainly blood, about three days ago.  Area is golf ball size, hard and painful to palpation.

## 2015-09-10 NOTE — ED Provider Notes (Signed)
[facility] Emergency Department Provider Note  ____________________________________________  Time seen: Approximately 4:31 PM  I have reviewed the triage vital signs and the nursing notes.   HISTORY  Chief Complaint Cyst    HPI Cynthia Harper is a 55 y.o. female , NAD, presents to emergency with 1 month history of skin sore to the back of her head. States she has had an abscess on the back of her head for approximately 1 month. It is continually worsening and has had increased pain with some bloody weeping over the last couple of days. Has not had any fevers but has had chills. Denies any abdominal pain, nausea, vomiting. Has not had any headaches, numbness, weakness, tingling. Denies any neck pain. 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 Also notes that she is diabetic and was without some of her medications a few months ago. She has recently restarted on her medications in the last couple of weeks and is getting her sugars better under control.   Past Medical History:  Diagnosis Date  . Diabetes mellitus without complication ([place])   . Hypertension   . Keloidal acne   . Pneumonia May 2016   Pt reports being hospitalized for pneumonia in May 2016.    Patient Active Problem List   Diagnosis Date Noted  . Hyperlipidemia 03/31/2015  . Pelvic mass 03/31/2015  . Diabetes ([place]) 03/10/2015  . Essential hypertension 03/10/2015  . Back pain 03/10/2015    Past Surgical History:  Procedure Laterality Date  . Fibroid Tumor Removal  2000    Prior to Admission medications   Medication Sig Start Date End Date Taking? Authorizing Provider  albuterol (PROVENTIL HFA;VENTOLIN HFA) 108 (90 Base) MCG/ACT inhaler Inhale 2 puffs into the lungs every 6 (six) hours as needed for wheezing or shortness of breath. 04/28/15   Larene Beach A McGowan, PA-C  amLODipine (NORVASC) 5 MG tablet Take 1 tablet (5 mg total) by mouth daily. Reported on 04/28/2015 04/28/15    Nori Riis, PA-C  aspirin 81 MG tablet Take 81 mg by mouth daily.    Historical Provider, MD  cephALEXin (KEFLEX) 500 MG capsule Take 1 capsule (500 mg total) by mouth 3 (three) times daily. 09/10/15   Thamar Holik L Ryleah Miramontes, PA-C  cyclobenzaprine (FLEXERIL) 10 MG tablet Take 1 tablet (10 mg total) by mouth 3 (three) times daily. 07/13/15   Larene Beach A McGowan, PA-C  gabapentin (NEURONTIN) 100 MG capsule Take 300 mg by mouth 2 (two) times daily.     Historical Provider, MD  hydrochlorothiazide (HYDRODIURIL) 25 MG tablet Take 25 mg by mouth daily.    Historical Provider, MD  HYDROcodone-acetaminophen (NORCO) 5-325 MG tablet Take 1 tablet by mouth every 6 (six) hours as needed for severe pain. 09/10/15   Oddie Bottger L Jakaleb Payer, PA-C  insulin aspart (NOVOLOG FLEXPEN) 100 UNIT/ML FlexPen Inject 13 Units into the skin 3 (three) times daily with meals. 04/28/15   Larene Beach A McGowan, PA-C  insulin aspart (NOVOLOG FLEXPEN) 100 UNIT/ML FlexPen Inject 12 Units into the skin 3 (three) times daily with meals. Patient not taking: Reported on 07/23/2015 04/28/15   Larene Beach A McGowan, PA-C  insulin detemir (LEVEMIR) 100 UNIT/ML injection Inject 50 Units into the skin 2 (two) times daily.     Historical Provider, MD  linagliptin (TRADJENTA) 5 MG TABS tablet Take 1 tablet (5 mg total) by mouth daily. 04/28/15   Larene Beach A McGowan, PA-C  lisinopril (PRINIVIL,ZESTRIL) 20 MG tablet Take 20 mg  by mouth 2 (two) times daily.    Historical Provider, MD  meloxicam (MOBIC) 15 MG tablet Take 15 mg by mouth daily as needed.  11/25/14   Historical Provider, MD  metFORMIN (GLUCOPHAGE) 500 MG tablet Take 1 tablet (500 mg total) by mouth 2 (two) times daily with a meal. Reported on 04/28/2015 04/28/15   Larene Beach A McGowan, PA-C  methocarbamol (ROBAXIN-750) 750 MG tablet Take 1 tablet (750 mg total) by mouth 4 (four) times daily. Patient not taking: Reported on 07/23/2015 07/06/15   Sable Feil, PA-C  oxyCODONE-acetaminophen (ROXICET) 5-325 MG tablet Take 1  tablet by mouth every 4 (four) hours as needed for severe pain. Patient not taking: Reported on 07/23/2015 07/06/15   Sable Feil, PA-C  rosuvastatin (CRESTOR) 5 MG tablet Take 1 tablet (5 mg total) by mouth daily. 04/28/15   Larene Beach A McGowan, PA-C  sulfamethoxazole-trimethoprim (BACTRIM DS,SEPTRA DS) 800-160 MG tablet Take 1 tablet by mouth 2 (two) times daily. 09/10/15   Tomi Grandpre L Elfrida Pixley, PA-C    Allergies Review of patient's allergies indicates no known allergies.  Family History  Problem Relation Age of Onset  . Diabetes Mother   . Hypertension Mother   . Cancer Mother   . Diabetes Father   . Hypertension Father   . Diabetes Brother     Social History Social History  Substance Use Topics  . Smoking status: Never Smoker  . Smokeless tobacco: Never Used  . Alcohol use No     Review of Systems  Constitutional: Positive chills. No fever Eyes: No visual changes.  Cardiovascular: No chest pain. Respiratory: No shortness of breath.  Gastrointestinal: No abdominal pain.  No nausea, vomiting.  Musculoskeletal: Negative for neck pain, general myalgias.  Skin: Positive abscess back of head that is tender and red. Negative for rash. Neurological: Negative for headaches, focal weakness or numbness. 10-point ROS otherwise negative.  ____________________________________________   PHYSICAL EXAM:  VITAL SIGNS: ED Triage Vitals  Enc Vitals Group     BP 09/10/15 1551 136/83     Pulse Rate 09/10/15 1551 (!) 110     Resp 09/10/15 1551 18     Temp 09/10/15 1551 98.1 F (36.7 C)     Temp Source 09/10/15 1551 Oral     SpO2 09/10/15 1551 98 %     Weight 09/10/15 1553 194 lb (88 kg)     Height 09/10/15 1553 5' (1.524 m)     Head Circumference --      Peak Flow --      Pain Score 09/10/15 1553 10     Pain Loc --      Pain Edu? --      Excl. in [place]? --      Constitutional: Alert and oriented. Well appearing and in no acute distress. Eyes: Conjunctivae are normal.  Head:  Atraumatic.  Neck: Supple with FROM Hematological/Lymphatic/Immunilogical: No cervical lymphadenopathy. Cardiovascular: Good peripheral circulation. Respiratory: Normal respiratory effort without tachypnea or retractions.  Neurologic:  Normal speech and language. No gross focal neurologic deficits are appreciated.  Skin:  7cm x 4cm oblong fluctuant abscess noted to the posterior scalp. No active oozing or weeping. Area of dried weeping noted laterally. Mild erythema noted to the skin with warmth. Skin is warm, dry.  No rash noted. Psychiatric: Mood and affect are normal. Speech and behavior are normal. Patient exhibits appropriate insight and judgement.   ____________________________________________   LABS  None ____________________________________________  EKG  None ____________________________________________  RADIOLOGY  None ____________________________________________    PROCEDURES  Procedure(s) performed: None   .Marland KitchenIncision and Drainage Date/Time: 09/10/2015 4:31 PM Performed by: Natale Milch L Authorized by: Braxton Feathers   Consent:    Consent obtained:  Verbal   Consent given by:  Patient   Risks discussed:  Bleeding, pain and infection   Alternatives discussed:  No treatment Location:    Type:  Abscess   Size:  7cm x 4cm oblong   Location:  Head   Head location:  Scalp Pre-procedure details:    Skin preparation:  Betadine Anesthesia (see MAR for exact dosages):    Anesthesia method:  Local infiltration   Local anesthetic:  Lidocaine 1% WITH epi Procedure type:    Complexity:  Complex Procedure details:    Needle aspiration: no     Incision types:  Single straight   Incision depth:  Subcutaneous   Scalpel blade:  11   Wound management:  Probed and deloculated   Drainage:  Bloody   Drainage amount:  Scant   Wound treatment:  Wound left open   Packing materials:  None Post-procedure details:    Patient tolerance of procedure:  Tolerated well, no  immediate complications      Medications  ketorolac (TORADOL) 30 MG/ML injection 30 mg (30 mg Intramuscular Given 09/10/15 1636)  lidocaine-EPINEPHrine (XYLOCAINE-EPINEPHrine) 1 %-1:200000 (PF) injection 10 mL (10 mLs Infiltration Given 09/10/15 1636)     ____________________________________________   INITIAL IMPRESSION / ASSESSMENT AND PLAN / ED COURSE  Pertinent labs & imaging results that were available during my care of the patient were reviewed by me and considered in my medical decision making (see chart for details).  Clinical Course    Patient's diagnosis is consistent with abscess of scalp. Patient will be discharged home with prescriptions for Bactrim DS, Keflex, and Norco to take as directed. Patient is to keep the area clean and dry. Patient is to follow up with her primary care provider in 48 hours for wound recheck. Patient is given ED precautions to return to the ED for any worsening or new symptoms.    ____________________________________________  FINAL CLINICAL IMPRESSION(S) / ED DIAGNOSES  Final diagnoses:  Abscess of head      NEW MEDICATIONS STARTED DURING THIS VISIT:  Discharge Medication List as of 09/10/2015  5:14 PM    START taking these medications   Details  cephALEXin (KEFLEX) 500 MG capsule Take 1 capsule (500 mg total) by mouth 3 (three) times daily., Starting Thu 09/10/2015, Print    HYDROcodone-acetaminophen (NORCO) 5-325 MG tablet Take 1 tablet by mouth every 6 (six) hours as needed for severe pain., Starting Thu 09/10/2015, Print    sulfamethoxazole-trimethoprim (BACTRIM DS,SEPTRA DS) 800-160 MG tablet Take 1 tablet by mouth 2 (two) times daily., Starting Thu 09/10/2015, [place], PA-C 09/10/15 [place], MD 09/10/15 [phone]

## 2015-09-14 ENCOUNTER — Ambulatory Visit
Admission: RE | Admit: 2015-09-14 | Discharge: 2015-09-14 | Disposition: A | Discharge status: Home / Self Care | Payer: Disability Insurance | Source: Ambulatory Visit | Attending: Orthopedic Surgery | Admitting: Orthopedic Surgery

## 2015-09-14 ENCOUNTER — Other Ambulatory Visit: Payer: Self-pay | Admitting: Orthopedic Surgery

## 2015-09-14 DIAGNOSIS — M1612 Unilateral primary osteoarthritis, left hip: Secondary | ICD-10-CM | POA: Diagnosis present

## 2015-09-14 DIAGNOSIS — I10 Essential (primary) hypertension: Secondary | ICD-10-CM | POA: Diagnosis not present

## 2015-09-14 DIAGNOSIS — E119 Type 2 diabetes mellitus without complications: Secondary | ICD-10-CM | POA: Diagnosis not present

## 2015-11-12 ENCOUNTER — Other Ambulatory Visit: Payer: Self-pay

## 2015-11-12 DIAGNOSIS — E119 Type 2 diabetes mellitus without complications: Secondary | ICD-10-CM

## 2015-11-13 LAB — HEMOGLOBIN A1C
Est. average glucose Bld gHb Est-mCnc: 315 mg/dL
HEMOGLOBIN A1C: 12.6 % — AB (ref 4.8–5.6)

## 2015-11-19 ENCOUNTER — Ambulatory Visit: Payer: Self-pay

## 2015-12-01 ENCOUNTER — Ambulatory Visit: Payer: Self-pay

## 2015-12-04 ENCOUNTER — Telehealth: Payer: Self-pay

## 2015-12-04 NOTE — Telephone Encounter (Signed)
PT called in to reschedule appt. No answer. Left msg.

## 2015-12-10 ENCOUNTER — Other Ambulatory Visit: Payer: Self-pay | Admitting: Urology

## 2015-12-10 ENCOUNTER — Ambulatory Visit: Payer: Self-pay

## 2015-12-10 DIAGNOSIS — I10 Essential (primary) hypertension: Secondary | ICD-10-CM

## 2015-12-10 MED ORDER — HYDROCHLOROTHIAZIDE 25 MG PO TABS: 25.0000 mg | ORAL_TABLET | Freq: Every day | ORAL | 0 refills | Status: DC

## 2015-12-24 ENCOUNTER — Ambulatory Visit: Payer: Self-pay | Admitting: Adult Health Nurse Practitioner

## 2015-12-24 VITALS — BP 158/90 | HR 88 | Wt 188.0 lb

## 2015-12-24 DIAGNOSIS — I10 Essential (primary) hypertension: Secondary | ICD-10-CM

## 2015-12-24 DIAGNOSIS — E785 Hyperlipidemia, unspecified: Secondary | ICD-10-CM

## 2015-12-24 DIAGNOSIS — E119 Type 2 diabetes mellitus without complications: Secondary | ICD-10-CM

## 2015-12-24 MED ORDER — METFORMIN HCL 500 MG PO TABS: 500.0000 mg | ORAL_TABLET | Freq: Two times a day (BID) | ORAL | 3 refills | Status: DC

## 2015-12-24 MED ORDER — MOMETASONE FUROATE 0.1 % EX CREA: 1.0000 "application " | TOPICAL_CREAM | Freq: Every day | CUTANEOUS | 0 refills | Status: AC

## 2015-12-24 MED ORDER — INSULIN ASPART 100 UNIT/ML FLEXPEN: 13.0000 [IU] | PEN_INJECTOR | Freq: Three times a day (TID) | SUBCUTANEOUS | 11 refills | Status: DC

## 2015-12-24 NOTE — Progress Notes (Signed)
Patient: Cynthia Harper Female    DOB: Jun 17, 1960   55 y.o.   MRN: [place]:[id number] Visit Date: 12/24/2015  Today's Provider: [place]   Chief Complaint  Patient presents with  . Follow-up    right eye irritation  . Diabetes    BG: 455 mg/dl   Subjective:    HPI DM:  CBG: 455 tonight. Reports that sugars were running high even when taking insulin as scheduled.  States she has been out of Novolog x 2 week. Usually takes 13 units TID with meals.  Pt takes CBG daily states it is running high.  States she is trying to monitor her diet.  States she is not regularly exercising.  A1C on 10.19 was 12.6.    HTN:  Pt reports taking medications as directed.  Denies dizziness, CP.   HLD:  Needs lipid panel checked.  No LDL results at this time.  Taking medication as directed.   States she has the acne bumps that are sore and bothering her on her head.  They have occurred before and were treated with topical creams and antibiotics.       No Known Allergies Previous Medications   ALBUTEROL (PROVENTIL HFA;VENTOLIN HFA) 108 (90 BASE) MCG/ACT INHALER    Inhale 2 puffs into the lungs every 6 (six) hours as needed for wheezing or shortness of breath.   AMLODIPINE (NORVASC) 5 MG TABLET    Take 1 tablet (5 mg total) by mouth daily. Reported on 04/28/2015   ASPIRIN 81 MG TABLET    Take 81 mg by mouth daily.   CYCLOBENZAPRINE (FLEXERIL) 10 MG TABLET    Take 1 tablet (10 mg total) by mouth 3 (three) times daily.   GABAPENTIN (NEURONTIN) 100 MG CAPSULE    Take 300 mg by mouth 2 (two) times daily.    HYDROCHLOROTHIAZIDE (HYDRODIURIL) 25 MG TABLET    Take 1 tablet (25 mg total) by mouth daily.   IBUPROFEN (ADVIL,MOTRIN) 600 MG TABLET    Take 600 mg by mouth every 6 (six) hours as needed.   INSULIN DETEMIR (LEVEMIR) 100 UNIT/ML INJECTION    Inject 50 Units into the skin 2 (two) times daily.    LINAGLIPTIN (TRADJENTA) 5 MG TABS TABLET    Take 1 tablet (5 mg total) by mouth daily.   LISINOPRIL (PRINIVIL,ZESTRIL) 20 MG TABLET    TAKE ONE TABLET BY MOUTH 2 TIMES A DAY   MELOXICAM (MOBIC) 15 MG TABLET    Take 15 mg by mouth daily as needed.    ROSUVASTATIN (CRESTOR) 5 MG TABLET    Take 1 tablet (5 mg total) by mouth daily.   SULFAMETHOXAZOLE-TRIMETHOPRIM (BACTRIM DS,SEPTRA DS) 800-160 MG TABLET    Take 1 tablet by mouth 2 (two) times daily.    Review of Systems  All other systems reviewed and are negative.   Social History  Substance Use Topics  . Smoking status: Never Smoker  . Smokeless tobacco: Never Used  . Alcohol use No   Objective:   BP (!) 158/90   Pulse 88   Wt 188 lb (85.3 kg)   LMP  (Approximate) Comment: 1 year ago  SpO2 98%   BMI 36.72 kg/m   Physical Exam  Constitutional: She is oriented to person, place, and time. She appears well-developed and well-nourished.  HENT:  Head: Normocephalic and atraumatic.  Alopecia. Small bumps to back of the head with no redness or warmth. No drainage.   Eyes: Pupils are equal, round, and reactive  to light.  Neck: Normal range of motion. Neck supple.  Cardiovascular: Normal rate, regular rhythm and normal heart sounds.   Pulmonary/Chest: Effort normal and breath sounds normal.  Abdominal: Soft. Bowel sounds are normal.  Neurological: She is alert and oriented to person, place, and time.  Skin: Skin is warm and dry.  Psychiatric: She has a normal mood and affect.  Vitals reviewed.       Assessment & Plan:           DM:  Not controlled.  Increase Novolog to 15 units TID.  Continue other medications.  Continue to check CBG daily.  Encourage diabetic diet and exercise.   HTN:  Not controlled.  Repeat BP 140/82.  Goal BP <130/80. Continue current medication regimen.  Encourage low salt diet and exercise.   HLD:   Continue current regimen.  Encourage low cholesterol, low fat diet and exercise.   Will prescribe Mometasone to scalp daily for 14 days.     Routine labs one week prior to next  OV.  FU in 2 months.    [facility]

## 2015-12-29 ENCOUNTER — Other Ambulatory Visit: Payer: Self-pay | Admitting: Adult Health Nurse Practitioner

## 2015-12-29 MED ORDER — INSULIN GLULISINE 100 UNIT/ML IJ SOLN: 12.0000 [IU] | Freq: Three times a day (TID) | INTRAMUSCULAR | 0 refills | Status: DC

## 2016-01-07 ENCOUNTER — Other Ambulatory Visit: Payer: Self-pay | Admitting: Urology

## 2016-01-07 DIAGNOSIS — I10 Essential (primary) hypertension: Secondary | ICD-10-CM

## 2016-02-18 ENCOUNTER — Ambulatory Visit: Payer: Self-pay | Admitting: Adult Health Nurse Practitioner

## 2016-02-18 VITALS — BP 145/83 | HR 77 | Wt 197.0 lb

## 2016-02-18 DIAGNOSIS — E119 Type 2 diabetes mellitus without complications: Secondary | ICD-10-CM

## 2016-02-18 LAB — GLUCOSE, POCT (MANUAL RESULT ENTRY): POC GLUCOSE: 88 mg/dL (ref 70–99)

## 2016-02-18 MED ORDER — MELOXICAM 15 MG PO TABS: 15.0000 mg | ORAL_TABLET | Freq: Every day | ORAL | 5 refills | Status: AC | PRN

## 2016-02-18 MED ORDER — ALBUTEROL SULFATE HFA 108 (90 BASE) MCG/ACT IN AERS: 2.0000 | INHALATION_SPRAY | Freq: Four times a day (QID) | RESPIRATORY_TRACT | 3 refills | Status: DC | PRN

## 2016-02-18 MED ORDER — LINAGLIPTIN 5 MG PO TABS: 5.0000 mg | ORAL_TABLET | Freq: Every day | ORAL | 3 refills | Status: DC

## 2016-02-18 MED ORDER — GABAPENTIN 400 MG PO CAPS: 400.0000 mg | ORAL_CAPSULE | Freq: Three times a day (TID) | ORAL | 3 refills | Status: DC

## 2016-02-18 MED ORDER — ROSUVASTATIN CALCIUM 5 MG PO TABS: 5.0000 mg | ORAL_TABLET | Freq: Every day | ORAL | 3 refills | Status: DC

## 2016-02-18 NOTE — Patient Instructions (Addendum)
Pt medications refilled Pt referred to opthalmology for diabetic retinopathy Labs ordered today: A1C, TSH, CBC, Microalbumin with creat., Lipids, CMET  Follow up in 3 months

## 2016-02-18 NOTE — Progress Notes (Signed)
Subjective:    Patient ID: Cynthia Harper, female    DOB: 07-Jun-1960, 56 y.o.   MRN: [id number]  HPI  Pt presents with back pain Pt reports long periods of standing and occasional falls  Pt also presents with diabetes for 24 years   Patient also wants eye checked  Pt sees spots and has difficulty with her vision  Patient Active Problem List   Diagnosis Date Noted  . Hyperlipidemia 03/31/2015  . Pelvic mass 03/31/2015  . Diabetes ([place]) 03/10/2015  . Essential hypertension 03/10/2015  . Back pain 03/10/2015    Allergies as of 02/18/2016   No Known Allergies     Medication List       Accurate as of 02/18/16  6:46 PM. Always use your most recent med list.          albuterol 108 (90 Base) MCG/ACT inhaler Commonly known as:  PROVENTIL HFA;VENTOLIN HFA Inhale 2 puffs into the lungs every 6 (six) hours as needed for wheezing or shortness of breath.   amLODipine 5 MG tablet Commonly known as:  NORVASC Take 1 tablet (5 mg total) by mouth daily. Reported on 04/28/2015   aspirin 81 MG tablet Take 81 mg by mouth daily.   cyclobenzaprine 10 MG tablet Commonly known as:  FLEXERIL Take 1 tablet (10 mg total) by mouth 3 (three) times daily.   gabapentin 100 MG capsule Commonly known as:  NEURONTIN Take 300 mg by mouth 2 (two) times daily.   hydrochlorothiazide 25 MG tablet Commonly known as:  HYDRODIURIL TAKE 1 TABLET BY MOUTH DAILY   ibuprofen 600 MG tablet Commonly known as:  ADVIL,MOTRIN Take 600 mg by mouth every 6 (six) hours as needed.   insulin aspart 100 UNIT/ML FlexPen Commonly known as:  NOVOLOG FLEXPEN Inject 13 Units into the skin 3 (three) times daily with meals.   insulin detemir 100 UNIT/ML injection Commonly known as:  LEVEMIR Inject 50 Units into the skin 2 (two) times daily.   insulin glulisine 100 UNIT/ML injection Commonly known as:  APIDRA Inject 0.12 mLs (12 Units total) into the skin 3 (three) times daily before meals.   linagliptin 5 MG  Tabs tablet Commonly known as:  TRADJENTA Take 1 tablet (5 mg total) by mouth daily.   lisinopril 20 MG tablet Commonly known as:  PRINIVIL,ZESTRIL TAKE ONE TABLET BY MOUTH 2 TIMES A DAY   meloxicam 15 MG tablet Commonly known as:  MOBIC Take 15 mg by mouth daily as needed.   metFORMIN 500 MG tablet Commonly known as:  GLUCOPHAGE Take 1 tablet (500 mg total) by mouth 2 (two) times daily with a meal. Reported on 04/28/2015   mometasone 0.1 % cream Commonly known as:  ELOCON Apply 1 application topically daily.   rosuvastatin 5 MG tablet Commonly known as:  CRESTOR Take 1 tablet (5 mg total) by mouth daily.         Review of Systems BP (!) 145/83 (BP Location: Left Arm, Patient Position: Sitting)   Pulse 77   Wt 197 lb (89.4 kg)   BMI 38.47 kg/m   Pt tender over her left SI joint loss of touch in left leg Strength in legs is good     Objective:   Physical Exam  Constitutional: She is oriented to person, place, and time.  Cardiovascular: Normal rate, regular rhythm and normal heart sounds.   Pulmonary/Chest: Effort normal and breath sounds normal.  Neurological: She is alert and oriented to person, place, and  time.          Assessment & Plan:  Medications refilled Gabapentin changed to 400mg  3x daily Referral for opthalmology for diabetic retinopathy Labs ordered: a1c TSH CBC Urine Microalbumin with creat. Ratio, Lipid, CMET

## 2016-02-19 LAB — CBC WITH DIFFERENTIAL
Basophils Absolute: 0 10*3/uL (ref 0.0–0.2)
Basos: 1 %
EOS (ABSOLUTE): 0.2 10*3/uL (ref 0.0–0.4)
EOS: 3 %
HEMATOCRIT: 37.5 % (ref 34.0–46.6)
Hemoglobin: 12.9 g/dL (ref 11.1–15.9)
IMMATURE GRANULOCYTES: 0 %
Immature Grans (Abs): 0 10*3/uL (ref 0.0–0.1)
LYMPHS ABS: 2.4 10*3/uL (ref 0.7–3.1)
Lymphs: 38 %
MCH: 25 pg — ABNORMAL LOW (ref 26.6–33.0)
MCHC: 34.4 g/dL (ref 31.5–35.7)
MCV: 73 fL — AB (ref 79–97)
MONOS ABS: 0.8 10*3/uL (ref 0.1–0.9)
Monocytes: 13 %
NEUTROS PCT: 45 %
Neutrophils Absolute: 3 10*3/uL (ref 1.4–7.0)
RBC: 5.16 x10E6/uL (ref 3.77–5.28)
RDW: 15.8 % — ABNORMAL HIGH (ref 12.3–15.4)
WBC: 6.5 10*3/uL (ref 3.4–10.8)

## 2016-02-19 LAB — COMPREHENSIVE METABOLIC PANEL
A/G RATIO: 1.5 (ref 1.2–2.2)
ALK PHOS: 89 IU/L (ref 39–117)
ALT: 26 IU/L (ref 0–32)
AST: 18 IU/L (ref 0–40)
Albumin: 4.3 g/dL (ref 3.5–5.5)
BUN/Creatinine Ratio: 25 — ABNORMAL HIGH (ref 9–23)
BUN: 22 mg/dL (ref 6–24)
Bilirubin Total: <0.2 mg/dL (ref 0.0–1.2)
CO2: 28 mmol/L (ref 18–29)
Calcium: 10 mg/dL (ref 8.7–10.2)
Chloride: 100 mmol/L (ref 96–106)
Creatinine, Ser: 0.88 mg/dL (ref 0.57–1.00)
GFR calc Af Amer: 87 mL/min/{1.73_m2} (ref >59)
GFR calc non Af Amer: 75 mL/min/{1.73_m2} (ref >59)
GLOBULIN, TOTAL: 2.8 g/dL (ref 1.5–4.5)
Glucose: 78 mg/dL (ref 65–99)
POTASSIUM: 4.2 mmol/L (ref 3.5–5.2)
SODIUM: 145 mmol/L — AB (ref 134–144)
Total Protein: 7.1 g/dL (ref 6.0–8.5)

## 2016-02-19 LAB — HEMOGLOBIN A1C
Est. average glucose Bld gHb Est-mCnc: 306 mg/dL
HEMOGLOBIN A1C: 12.3 % — AB (ref 4.8–5.6)

## 2016-02-19 LAB — MICROALBUMIN / CREATININE URINE RATIO
CREATININE, UR: 71.6 mg/dL
MICROALBUM., U, RANDOM: 505.6 ug/mL
Microalb/Creat Ratio: 706.1 mg/g creat — ABNORMAL HIGH (ref 0.0–30.0)

## 2016-02-19 LAB — TSH: TSH: 4.2 u[IU]/mL (ref 0.450–4.500)

## 2016-02-24 ENCOUNTER — Telehealth: Payer: Self-pay

## 2016-02-24 NOTE — Telephone Encounter (Signed)
-----   Message from Staci Acosta, NP sent at 02/23/2016  5:54 PM EST ----- A1C about the same. Encourage low fat/carb diet and exercise. Continue current medications.

## 2016-02-24 NOTE — Telephone Encounter (Signed)
Called pt with results. PT verbalized understanding. 

## 2016-03-03 ENCOUNTER — Ambulatory Visit: Payer: Self-pay | Admitting: Ophthalmology

## 2016-03-07 LAB — HM DIABETES EYE EXAM

## 2016-03-09 ENCOUNTER — Other Ambulatory Visit: Payer: Self-pay

## 2016-03-17 ENCOUNTER — Telehealth: Payer: Self-pay

## 2016-03-17 NOTE — Telephone Encounter (Signed)
Placed signed application/script in [facility] folder for pickup. 

## 2016-03-17 NOTE — Telephone Encounter (Signed)
Received PAP application from [facility] for Levemir vial placed for provider to sign.

## 2016-03-25 ENCOUNTER — Telehealth: Payer: Self-pay | Admitting: Pharmacist

## 2016-03-25 NOTE — Telephone Encounter (Signed)
Faxed refill request to [employer] for Levemir Vial-Inject 50 units daily in the am & 50 units daily in the pm.

## 2016-03-31 ENCOUNTER — Telehealth: Payer: Self-pay

## 2016-03-31 NOTE — Telephone Encounter (Signed)
Received PAP application from [facility] for Novolog placed for provider to sign.

## 2016-04-14 NOTE — Telephone Encounter (Signed)
Placed signed application/script in [facility] folder for pickup. 

## 2016-04-21 ENCOUNTER — Telehealth: Payer: Self-pay | Admitting: Pharmacist

## 2016-04-21 NOTE — Telephone Encounter (Signed)
04/21/2016-Novolog Vials--Patient has returned the [employer] pap application to [place], she did not return any income or taxes, also did not complete or sign the attached letter of support & [id number]. We have not received the scripts back from provider. We previously sent patient a Eligibility packet 02/15/16 to complete and return, she has not returned. I will mail patient a second request on the Eligibility packet. We can not order meds until we receive income or notarized letter of support & taxes or signed [phone]. Also for this insulin if patient has no income, they will request a Medicaid denial, I will fill that in on request for information.

## 2016-04-22 ENCOUNTER — Other Ambulatory Visit: Payer: Self-pay

## 2016-04-22 DIAGNOSIS — I10 Essential (primary) hypertension: Secondary | ICD-10-CM

## 2016-04-22 MED ORDER — HYDROCHLOROTHIAZIDE 25 MG PO TABS: 25.0000 mg | ORAL_TABLET | Freq: Every day | ORAL | 1 refills | Status: DC

## 2016-05-02 ENCOUNTER — Telehealth: Payer: Self-pay | Admitting: Pharmacist

## 2016-05-02 NOTE — Telephone Encounter (Signed)
05/02/16 Received provider portion back sign, patient returned her portion but did not send poi/support/taxes/[id number]. This information has been requested from patient: 02/15/16, 03/31/16, & 04/21/16.

## 2016-05-19 ENCOUNTER — Ambulatory Visit: Payer: Self-pay

## 2016-05-19 ENCOUNTER — Other Ambulatory Visit: Payer: Self-pay

## 2016-05-19 DIAGNOSIS — I10 Essential (primary) hypertension: Secondary | ICD-10-CM

## 2016-05-19 MED ORDER — LISINOPRIL 20 MG PO TABS: ORAL_TABLET | ORAL | 0 refills | Status: DC

## 2016-05-19 NOTE — Telephone Encounter (Signed)
Patient called, requesting refill on lisinopril.  Pharmacy of choice is [facility].  Patient has MD appt on 05/31/16 at 7:30pm.

## 2016-05-31 ENCOUNTER — Ambulatory Visit: Payer: Self-pay | Admitting: Adult Health Nurse Practitioner

## 2016-05-31 VITALS — BP 140/83 | HR 92 | Temp 98.3°F | Wt 197.9 lb

## 2016-05-31 DIAGNOSIS — I10 Essential (primary) hypertension: Secondary | ICD-10-CM

## 2016-05-31 DIAGNOSIS — M25552 Pain in left hip: Secondary | ICD-10-CM

## 2016-05-31 DIAGNOSIS — E119 Type 2 diabetes mellitus without complications: Secondary | ICD-10-CM

## 2016-05-31 DIAGNOSIS — E785 Hyperlipidemia, unspecified: Secondary | ICD-10-CM

## 2016-05-31 LAB — GLUCOSE, POCT (MANUAL RESULT ENTRY): POC Glucose: 75 mg/dl (ref 70–99)

## 2016-05-31 MED ORDER — AMLODIPINE BESYLATE 5 MG PO TABS: 5.0000 mg | ORAL_TABLET | Freq: Every day | ORAL | 3 refills | Status: DC

## 2016-05-31 MED ORDER — ALBUTEROL SULFATE HFA 108 (90 BASE) MCG/ACT IN AERS: 2.0000 | INHALATION_SPRAY | Freq: Four times a day (QID) | RESPIRATORY_TRACT | 3 refills | Status: DC | PRN

## 2016-05-31 NOTE — Progress Notes (Signed)
Patient: Cynthia Harper Female    DOB: [birth year]   56 y.o.   MRN: [id number] Visit Date: 05/31/2016  Today's Provider: Staci Acosta, NP   Chief Complaint  Patient presents with  . Annual Exam  . Hip Pain    L side; fell in bathtub a month ago   Subjective:    HPI   DM:  Taking 50 units Levemir BID.  Novolog 15 units TID.  CBGs averaging 200s- has not checked in two weeks.  Pt states that she has been trying to eat better.  Last A1C was 12.3.   HTN/HLD:  Taking medications as directed.  Denies dizziness/CP.   Pt states that she has bumps in her head along with alopecia.   Pt states that she fell in the bathtub on 4.1 and her hip has been hurting worse.  Pt states that she is having difficulty putting her shoes or pantyhose.  Pain with movement.     No Known Allergies Previous Medications   ALBUTEROL (PROVENTIL HFA;VENTOLIN HFA) 108 (90 BASE) MCG/ACT INHALER    Inhale 2 puffs into the lungs every 6 (six) hours as needed for wheezing or shortness of breath.   AMLODIPINE (NORVASC) 5 MG TABLET    Take 1 tablet (5 mg total) by mouth daily. Reported on 04/28/2015   ASPIRIN 81 MG TABLET    Take 81 mg by mouth daily.   CYCLOBENZAPRINE (FLEXERIL) 10 MG TABLET    Take 1 tablet (10 mg total) by mouth 3 (three) times daily.   GABAPENTIN (NEURONTIN) 400 MG CAPSULE    Take 1 capsule (400 mg total) by mouth 3 (three) times daily.   HYDROCHLOROTHIAZIDE (HYDRODIURIL) 25 MG TABLET    Take 1 tablet (25 mg total) by mouth daily.   IBUPROFEN (ADVIL,MOTRIN) 600 MG TABLET    Take 600 mg by mouth every 6 (six) hours as needed.   INSULIN ASPART (NOVOLOG FLEXPEN) 100 UNIT/ML FLEXPEN    Inject 13 Units into the skin 3 (three) times daily with meals.   INSULIN DETEMIR (LEVEMIR) 100 UNIT/ML INJECTION    Inject 50 Units into the skin 2 (two) times daily.    INSULIN GLULISINE (APIDRA) 100 UNIT/ML INJECTION    Inject 0.12 mLs (12 Units total) into the skin 3 (three) times daily before meals.   LINAGLIPTIN (TRADJENTA) 5 MG TABS TABLET    Take 1 tablet (5 mg total) by mouth daily.   LISINOPRIL (PRINIVIL,ZESTRIL) 20 MG TABLET    TAKE ONE TABLET BY MOUTH 2 TIMES A DAY   MELOXICAM (MOBIC) 15 MG TABLET    Take 1 tablet (15 mg total) by mouth daily as needed.   METFORMIN (GLUCOPHAGE) 500 MG TABLET    Take 1 tablet (500 mg total) by mouth 2 (two) times daily with a meal. Reported on 04/28/2015   MOMETASONE (ELOCON) 0.1 % CREAM    Apply 1 application topically daily.   ROSUVASTATIN (CRESTOR) 5 MG TABLET    Take 1 tablet (5 mg total) by mouth daily.    Review of Systems  All other systems reviewed and are negative.   Social History  Substance Use Topics  . Smoking status: Never Smoker  . Smokeless tobacco: Never Used  . Alcohol use No   Objective:   BP 140/83   Pulse 92   Temp 98.3 F (36.8 C)   Wt 197 lb 14.4 oz (89.8 kg)   BMI 38.65 kg/m   Physical Exam  Constitutional: She is oriented to person,  place, and time. She appears well-developed and well-nourished.  HENT:  Head: Normocephalic.  Eyes: Pupils are equal, round, and reactive to light.  Neck: Normal range of motion. Neck supple.  Cardiovascular: Normal rate, regular rhythm and normal heart sounds.   Pulmonary/Chest: Effort normal and breath sounds normal.  Abdominal: Soft. Bowel sounds are normal.  Musculoskeletal:  Limited ER/IR of the L hip with pain. No pain with palpation or warmth or edema.   Neurological: She is alert and oriented to person, place, and time.  Vitals reviewed.       Assessment & Plan:         FU next week for routine labs.   HTN:  Borderline.  Goal BP <140/80.  Continue current medication regimen.  Encourage low salt diet and exercise.  Encouraged to take BP at home.  Consider medication adjustment at next OV.   DM:  Not controlled.  Encourage diabetic diet and exercise.  Continue current medication regimen.  Continue to check CBGs at home.   HLD:  Controlled.   Continue  current regimen.  Encourage low cholesterol, low fat diet and exercise.   Continue gabapentin for hip pain and Meloxicam as needed.  Reviewed L hip xray from August.  Will order L hip xray due to pain with ROM.      Pt awaiting surgery on her eye.    Staci Acosta, NP   [facility]

## 2016-06-02 ENCOUNTER — Other Ambulatory Visit: Payer: Self-pay

## 2016-06-08 ENCOUNTER — Other Ambulatory Visit: Payer: Self-pay

## 2016-06-08 DIAGNOSIS — E119 Type 2 diabetes mellitus without complications: Secondary | ICD-10-CM

## 2016-06-08 DIAGNOSIS — E785 Hyperlipidemia, unspecified: Secondary | ICD-10-CM

## 2016-06-08 DIAGNOSIS — I10 Essential (primary) hypertension: Secondary | ICD-10-CM

## 2016-06-09 ENCOUNTER — Other Ambulatory Visit: Payer: Self-pay | Admitting: Adult Health Nurse Practitioner

## 2016-06-09 LAB — LIPID PANEL
CHOLESTEROL TOTAL: 285 mg/dL — AB (ref 100–199)
Chol/HDL Ratio: 7 ratio — ABNORMAL HIGH (ref 0.0–4.4)
HDL: 41 mg/dL (ref >39)
TRIGLYCERIDES: 488 mg/dL — AB (ref 0–149)

## 2016-06-09 LAB — CBC
Hematocrit: 36.5 % (ref 34.0–46.6)
Hemoglobin: 12.5 g/dL (ref 11.1–15.9)
MCH: 25.8 pg — AB (ref 26.6–33.0)
MCHC: 34.2 g/dL (ref 31.5–35.7)
MCV: 75 fL — AB (ref 79–97)
PLATELETS: 226 10*3/uL (ref 150–379)
RBC: 4.84 x10E6/uL (ref 3.77–5.28)
RDW: 14.4 % (ref 12.3–15.4)
WBC: 5.8 10*3/uL (ref 3.4–10.8)

## 2016-06-09 MED ORDER — ROSUVASTATIN CALCIUM 10 MG PO TABS: 10.0000 mg | ORAL_TABLET | Freq: Every day | ORAL | 1 refills | Status: DC

## 2016-06-10 ENCOUNTER — Telehealth: Payer: Self-pay

## 2016-06-10 NOTE — Telephone Encounter (Signed)
Called pt with results. PT verbalized understanding. 

## 2016-06-10 NOTE — Telephone Encounter (Signed)
-----   Message from Staci Acosta, NP sent at 06/09/2016  5:39 PM EDT ----- Have patient increase Zocor to 10mg  daily. Low cholesterol diet.

## 2016-06-17 ENCOUNTER — Other Ambulatory Visit: Payer: Self-pay | Admitting: Urology

## 2016-06-30 ENCOUNTER — Telehealth: Payer: Self-pay

## 2016-06-30 NOTE — Telephone Encounter (Signed)
Received PAP application from [facility] for [place] placed for provider to sign.

## 2016-07-05 ENCOUNTER — Telehealth: Payer: Self-pay | Admitting: Pharmacist

## 2016-07-05 NOTE — Telephone Encounter (Signed)
07/05/16 Mailing patient a Denial letter-Failure to provide proof of income, and re certification packet. Patient needs to provide 1 full month of check stubs (paid weekly & only provider 2 weeks), need taxes, insurance attestation, bank statements, patient contract & intake application. Therefore patient is not eligible until all this information is received. Will notify [place].

## 2016-07-13 NOTE — Telephone Encounter (Signed)
Placed signed application/script in [facility] folder for pickup. 

## 2016-08-17 ENCOUNTER — Telehealth: Payer: Self-pay | Admitting: Pharmacist

## 2016-08-17 NOTE — Telephone Encounter (Signed)
08/17/16 Faxed Re Enrollment application to [employer] for Tradjenta 5mg  Take 1 tablet daily.Cynthia Harper

## 2016-08-17 NOTE — Telephone Encounter (Signed)
08/17/16 Patient Eligibility for [facility] approved till Feb. 2019.Delos Haring

## 2016-08-30 ENCOUNTER — Other Ambulatory Visit: Payer: Self-pay | Admitting: Internal Medicine

## 2016-09-01 ENCOUNTER — Ambulatory Visit: Payer: Self-pay

## 2016-09-08 ENCOUNTER — Telehealth: Payer: Self-pay

## 2016-09-08 NOTE — Telephone Encounter (Signed)
Received PAP application from [facility] for Ventolin, Levemir, Novolog placed for provider to sign.

## 2016-09-08 NOTE — Telephone Encounter (Signed)
Placed signed application/script in [facility] folder for pickup. 

## 2016-09-22 ENCOUNTER — Telehealth: Payer: Self-pay | Admitting: Pharmacist

## 2016-09-22 ENCOUNTER — Ambulatory Visit: Payer: Self-pay

## 2016-09-22 NOTE — Telephone Encounter (Signed)
7/68/11 Faxed [place] application for Re Enrollment for Ventolin HFA 90 mcg Inhale 2 puffs into the lungs every 6 hours as needed.AJ 09/22/16 Faxed [employer] application for Re Enrollment on Levemir Vials Inject 50 units in the AM & 50 units in the PM #12 (Max daily dose 100 units) Also Novolog Vials Inject 13 units three times a daily with meals # 5 (Max daily dose 39 units).Delos Haring

## 2016-09-27 ENCOUNTER — Telehealth: Payer: Self-pay | Admitting: Nurse Practitioner

## 2016-09-27 NOTE — Telephone Encounter (Signed)
Called on 8/31 to cancel appt from the day before. Call back to reschedule

## 2016-10-03 ENCOUNTER — Telehealth: Payer: Self-pay | Admitting: Pharmacist

## 2016-10-03 NOTE — Telephone Encounter (Signed)
10/03/16 Called [employer] for refill on Tradjenta 5mg , they will ship to patient 10/18/16, order# [id number].Delos Haring

## 2016-10-26 ENCOUNTER — Ambulatory Visit: Payer: Self-pay | Admitting: Internal Medicine

## 2016-11-09 ENCOUNTER — Other Ambulatory Visit: Payer: Self-pay

## 2016-11-09 DIAGNOSIS — I10 Essential (primary) hypertension: Secondary | ICD-10-CM

## 2016-11-09 MED ORDER — LISINOPRIL 20 MG PO TABS
ORAL_TABLET | ORAL | 1 refills | Status: DC
Start: 2016-11-09 — End: 2017-06-23

## 2016-11-23 ENCOUNTER — Ambulatory Visit: Payer: Self-pay | Admitting: Internal Medicine

## 2016-11-23 ENCOUNTER — Encounter: Payer: Self-pay | Admitting: Internal Medicine

## 2016-11-23 VITALS — BP 142/88 | HR 83 | Wt 198.2 lb

## 2016-11-23 DIAGNOSIS — E119 Type 2 diabetes mellitus without complications: Secondary | ICD-10-CM

## 2016-11-23 DIAGNOSIS — R609 Edema, unspecified: Secondary | ICD-10-CM

## 2016-11-23 LAB — POCT GLYCOSYLATED HEMOGLOBIN (HGB A1C): Hemoglobin A1C: 11.9

## 2016-11-23 NOTE — Progress Notes (Signed)
Subjective:    Patient ID: Cynthia Harper, female    DOB: 27-Nov-1960, 56 y.o.   MRN: [id number]  HPI  Pt presents with a chief complaint of back pain. She took gabapentin this morning for the pain. She was told that the pain is caused by arthritis. Pt spends most of her day standing on her feet.  Pt is experiencing pain and swelling in her right hand. She takes medication for the pain (ibuprofen) and wears a brace.    Allergies as of 11/23/2016   No Known Allergies     Medication List       Accurate as of 11/23/16 11:41 AM. Always use your most recent med list.          albuterol 108 (90 Base) MCG/ACT inhaler Commonly known as:  PROVENTIL HFA;VENTOLIN HFA Inhale 2 puffs into the lungs every 6 (six) hours as needed for wheezing or shortness of breath.   amLODipine 5 MG tablet Commonly known as:  NORVASC TAKE ONE TABLET BY MOUTH EVERY DAY   aspirin 81 MG tablet Take 81 mg by mouth daily.   cyclobenzaprine 10 MG tablet Commonly known as:  FLEXERIL Take 1 tablet (10 mg total) by mouth 3 (three) times daily.   gabapentin 400 MG capsule Commonly known as:  NEURONTIN Take 1 capsule (400 mg total) by mouth 3 (three) times daily.   hydrochlorothiazide 25 MG tablet Commonly known as:  HYDRODIURIL Take 1 tablet (25 mg total) by mouth daily.   ibuprofen 600 MG tablet Commonly known as:  ADVIL,MOTRIN Take 600 mg by mouth every 6 (six) hours as needed.   insulin aspart 100 UNIT/ML FlexPen Commonly known as:  NOVOLOG FLEXPEN Inject 13 Units into the skin 3 (three) times daily with meals.   insulin detemir 100 UNIT/ML injection Commonly known as:  LEVEMIR Inject 50 Units into the skin 2 (two) times daily.   linagliptin 5 MG Tabs tablet Commonly known as:  TRADJENTA Take 1 tablet (5 mg total) by mouth daily.   lisinopril 20 MG tablet Commonly known as:  PRINIVIL,ZESTRIL TAKE ONE TABLET BY MOUTH 2 TIMES A DAY   meloxicam 15 MG tablet Commonly known as:  MOBIC Take 1  tablet (15 mg total) by mouth daily as needed.   metFORMIN 500 MG tablet Commonly known as:  GLUCOPHAGE TAKE ONE TABLET BY MOUTH 2 TIMES A DAY WITH A MEAL.   mometasone 0.1 % cream Commonly known as:  ELOCON Apply 1 application topically daily.   rosuvastatin 10 MG tablet Commonly known as:  CRESTOR Take 1 tablet (10 mg total) by mouth daily.      Patient Active Problem List   Diagnosis Date Noted  . Left hip pain 05/31/2016  . Hyperlipidemia 03/31/2015  . Pelvic mass 03/31/2015  . Diabetes mellitus without complication ([place]) 08/65/7846  . Essential hypertension 03/10/2015  . Back pain 03/10/2015     Review of Systems     Objective:   Physical Exam  Constitutional: She is oriented to person, place, and time.  Cardiovascular: Normal rate, regular rhythm and normal heart sounds.   Pulmonary/Chest: Effort normal and breath sounds normal.  Neurological: She is alert and oriented to person, place, and time.   BP (!) 142/88   Pulse 83   Wt 198 lb 3.2 oz (89.9 kg)   BMI 38.71 kg/m      Assessment & Plan:   Labs taken today. Return in a month for follow up on lab results.  Pt requested needles for her syringes.  Order x-rays of pt's right hand and wrist for pain and swelling. Schedule a follow-up appointment with Dr. Vickki Hearing in 2 weeks.

## 2016-11-24 LAB — COMPREHENSIVE METABOLIC PANEL
ALK PHOS: 73 IU/L (ref 39–117)
ALT: 15 IU/L (ref 0–32)
AST: 12 IU/L (ref 0–40)
Albumin/Globulin Ratio: 1.8 (ref 1.2–2.2)
Albumin: 4.2 g/dL (ref 3.5–5.5)
BUN/Creatinine Ratio: 18 (ref 9–23)
BUN: 21 mg/dL (ref 6–24)
CHLORIDE: 97 mmol/L (ref 96–106)
CO2: 24 mmol/L (ref 20–29)
CREATININE: 1.15 mg/dL — AB (ref 0.57–1.00)
Calcium: 9.7 mg/dL (ref 8.7–10.2)
GFR calc Af Amer: 62 mL/min/{1.73_m2} (ref >59)
GFR calc non Af Amer: 54 mL/min/{1.73_m2} — ABNORMAL LOW (ref >59)
GLUCOSE: 396 mg/dL — AB (ref 65–99)
Globulin, Total: 2.3 g/dL (ref 1.5–4.5)
Potassium: 4.4 mmol/L (ref 3.5–5.2)
Sodium: 136 mmol/L (ref 134–144)
Total Protein: 6.5 g/dL (ref 6.0–8.5)

## 2016-11-24 LAB — CBC
HEMATOCRIT: 37.5 % (ref 34.0–46.6)
Hemoglobin: 12.4 g/dL (ref 11.1–15.9)
MCH: 25.3 pg — ABNORMAL LOW (ref 26.6–33.0)
MCHC: 33.1 g/dL (ref 31.5–35.7)
MCV: 76 fL — AB (ref 79–97)
PLATELETS: 215 10*3/uL (ref 150–379)
RBC: 4.91 x10E6/uL (ref 3.77–5.28)
RDW: 14.4 % (ref 12.3–15.4)
WBC: 5.8 10*3/uL (ref 3.4–10.8)

## 2016-11-24 LAB — LIPID PANEL
CHOLESTEROL TOTAL: 183 mg/dL (ref 100–199)
Chol/HDL Ratio: 4.1 ratio (ref 0.0–4.4)
HDL: 45 mg/dL (ref >39)
LDL CALC: 85 mg/dL (ref 0–99)
TRIGLYCERIDES: 267 mg/dL — AB (ref 0–149)
VLDL Cholesterol Cal: 53 mg/dL — ABNORMAL HIGH (ref 5–40)

## 2016-11-24 LAB — URIC ACID: URIC ACID: 6.7 mg/dL (ref 2.5–7.1)

## 2016-11-24 LAB — URINALYSIS
BILIRUBIN UA: NEGATIVE
KETONES UA: NEGATIVE
Leukocytes, UA: NEGATIVE
NITRITE UA: NEGATIVE
Specific Gravity, UA: 1.017 (ref 1.005–1.030)
UUROB: 0.2 mg/dL (ref 0.2–1.0)
pH, UA: 6 (ref 5.0–7.5)

## 2016-11-24 LAB — MICROALBUMIN, URINE: MICROALBUM., U, RANDOM: 453.4 ug/mL

## 2016-11-24 LAB — SEDIMENTATION RATE: SED RATE: 18 mm/h (ref 0–40)

## 2016-11-24 LAB — TSH: TSH: 3.04 u[IU]/mL (ref 0.450–4.500)

## 2016-11-30 ENCOUNTER — Telehealth: Payer: Self-pay | Admitting: Pharmacist

## 2016-11-30 NOTE — Telephone Encounter (Signed)
°  11/30/16 Placed refill online with [facility] for Ventolin they will release 01/02/2017, order# [id number].Delos Haring

## 2016-12-07 ENCOUNTER — Ambulatory Visit: Payer: Self-pay | Admitting: Specialist

## 2016-12-14 ENCOUNTER — Ambulatory Visit: Payer: Self-pay | Admitting: Specialist

## 2016-12-14 DIAGNOSIS — M79671 Pain in right foot: Secondary | ICD-10-CM

## 2016-12-14 MED ORDER — NAPROXEN 250 MG PO TABS: 250.0000 mg | ORAL_TABLET | Freq: Two times a day (BID) | ORAL | 1 refills | Status: DC

## 2016-12-14 NOTE — Progress Notes (Signed)
   Subjective:    Patient ID: Cynthia Harper, female    DOB: March 28, 1960, 56 y.o.   MRN: [id number]  HPI 4 month hx of swelling R hand. No hx of trauma. 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   Last Sunday, she also fell out of bed injuring her R foot.   Review of Systems     Objective:   Physical Exam Swelling over dorsum 1st webspace. There is no errythermia or trophic changes. She has a FROM of wrist and fingers. There is a neg grind test.   She has a marked antalgic gait. On inspection, there is swelling over the sinus tarsi; it is not red or tender. There is no bony tenderness of foot/ankle.      Assessment & Plan:  She is to use ice TID. Sent in Naprosyn 250 BID. RTC next week. prn.   Also needs to use ice and Naprosyn. RTC prn.

## 2016-12-19 IMAGING — CR DG KNEE 1-2V*L*
2 series · 2 of 2 positions shown · non-contrast
Comparison: None.

CLINICAL DATA: Pain for 1 year

EXAM:
LEFT KNEE - 1-2 VIEW

[knee ap]
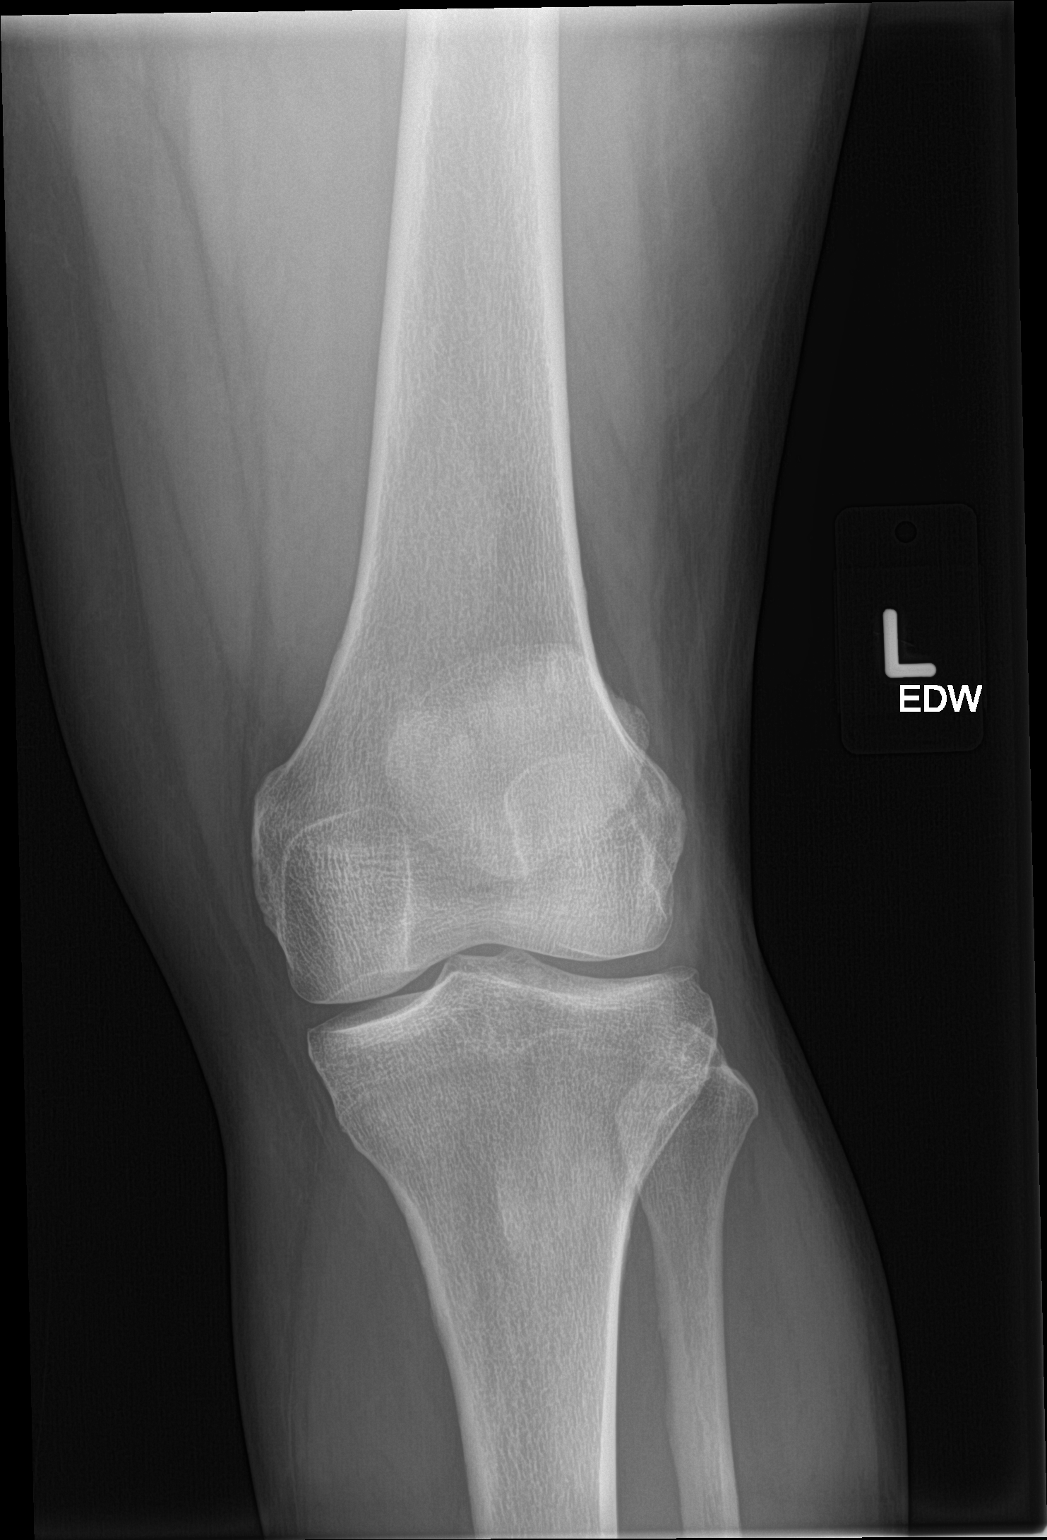

[knee lat]
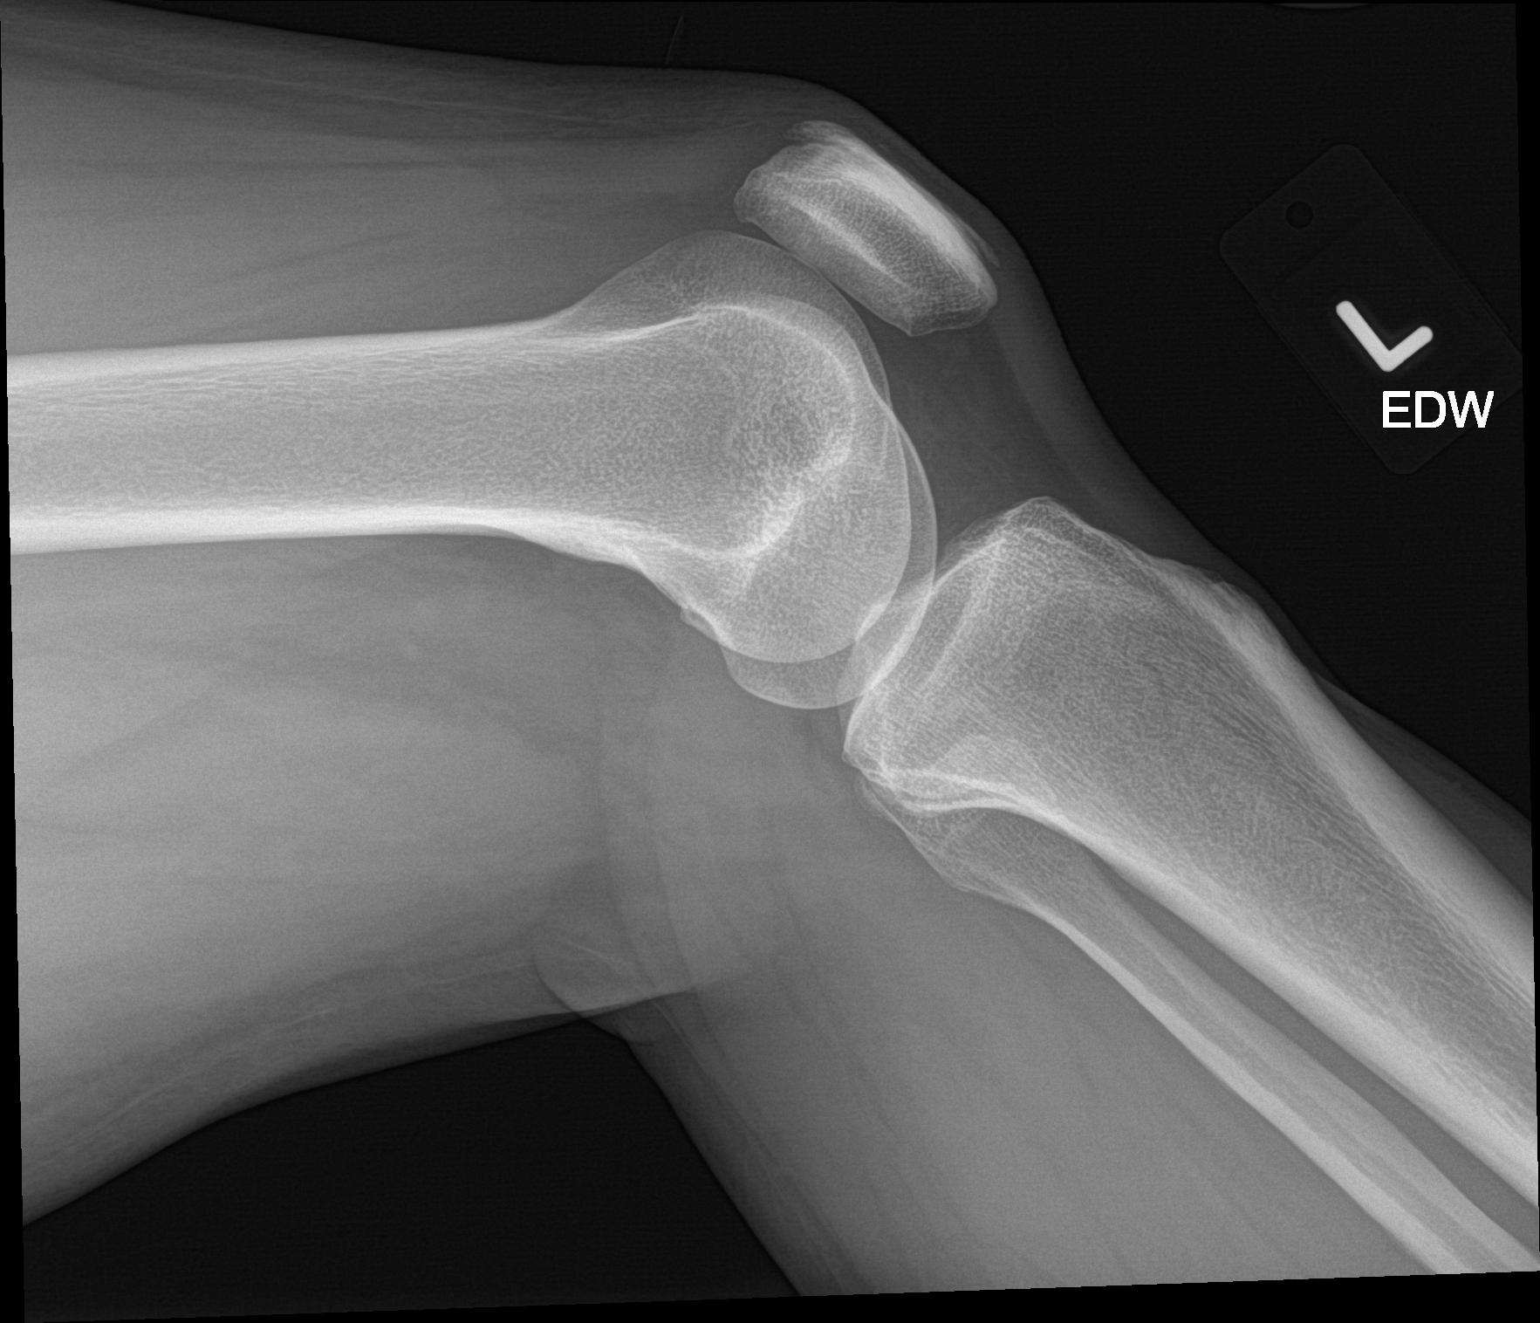

[2 of 2 positions shown; findings below may reference images not displayed]

FINDINGS: There are enthesopathic changes associated with the patella. No
fracture, dislocation, joint effusion, or other abnormality.
IMPRESSION: Patellar enthesopathic changes.

## 2016-12-19 IMAGING — CR DG ANKLE COMPLETE 3+V*L*
3 series · 3 of 3 positions shown · non-contrast
Comparison: None.

CLINICAL DATA: Left ankle pain

EXAM:
LEFT ANKLE COMPLETE - 3+ VIEW

[ankle ap]
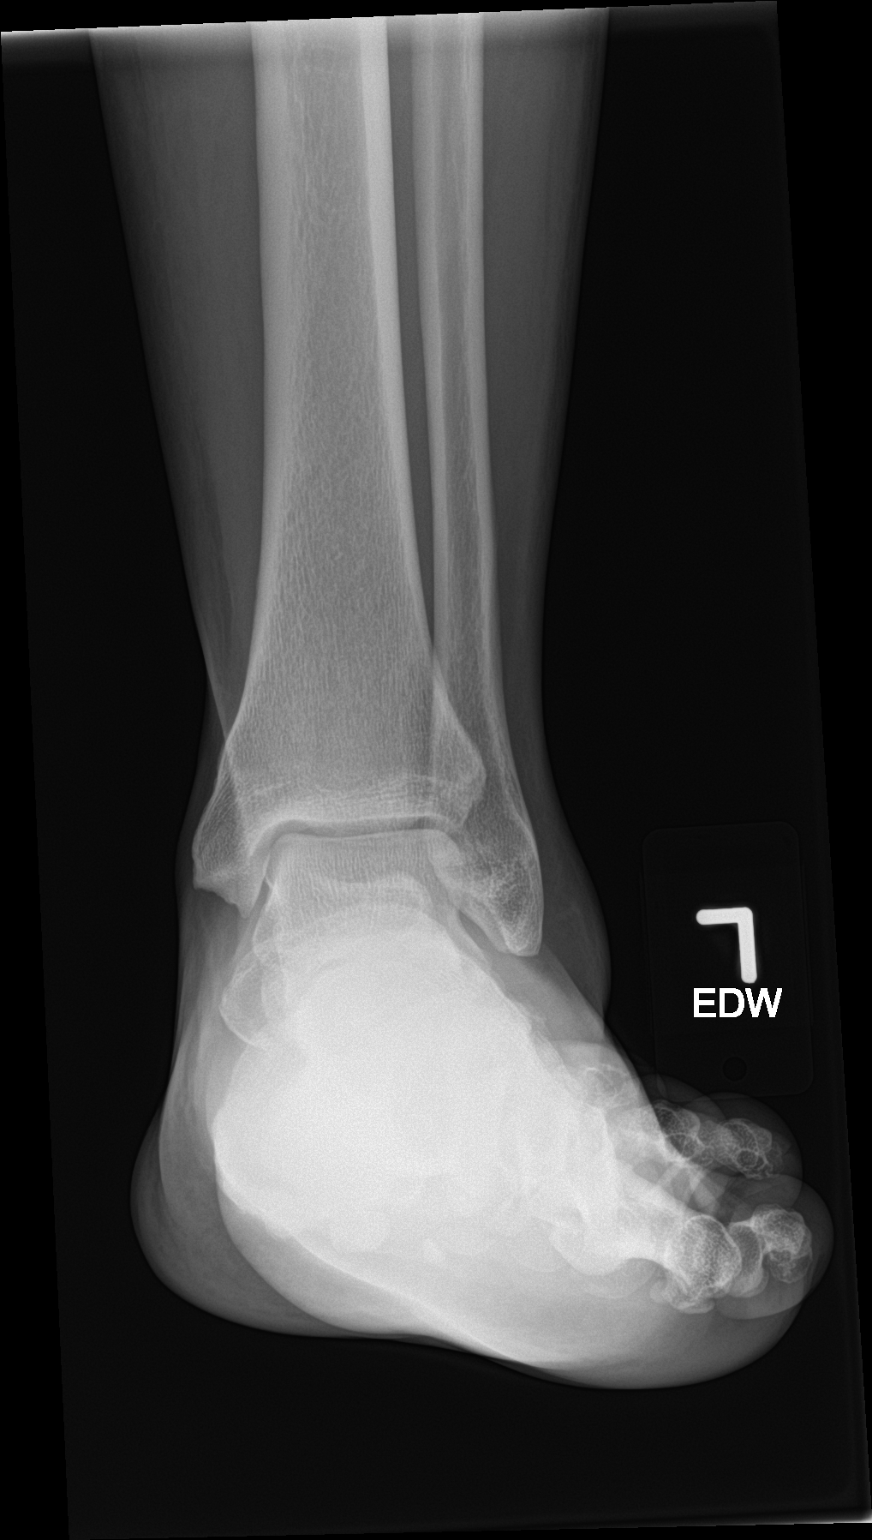

[ankle obl]
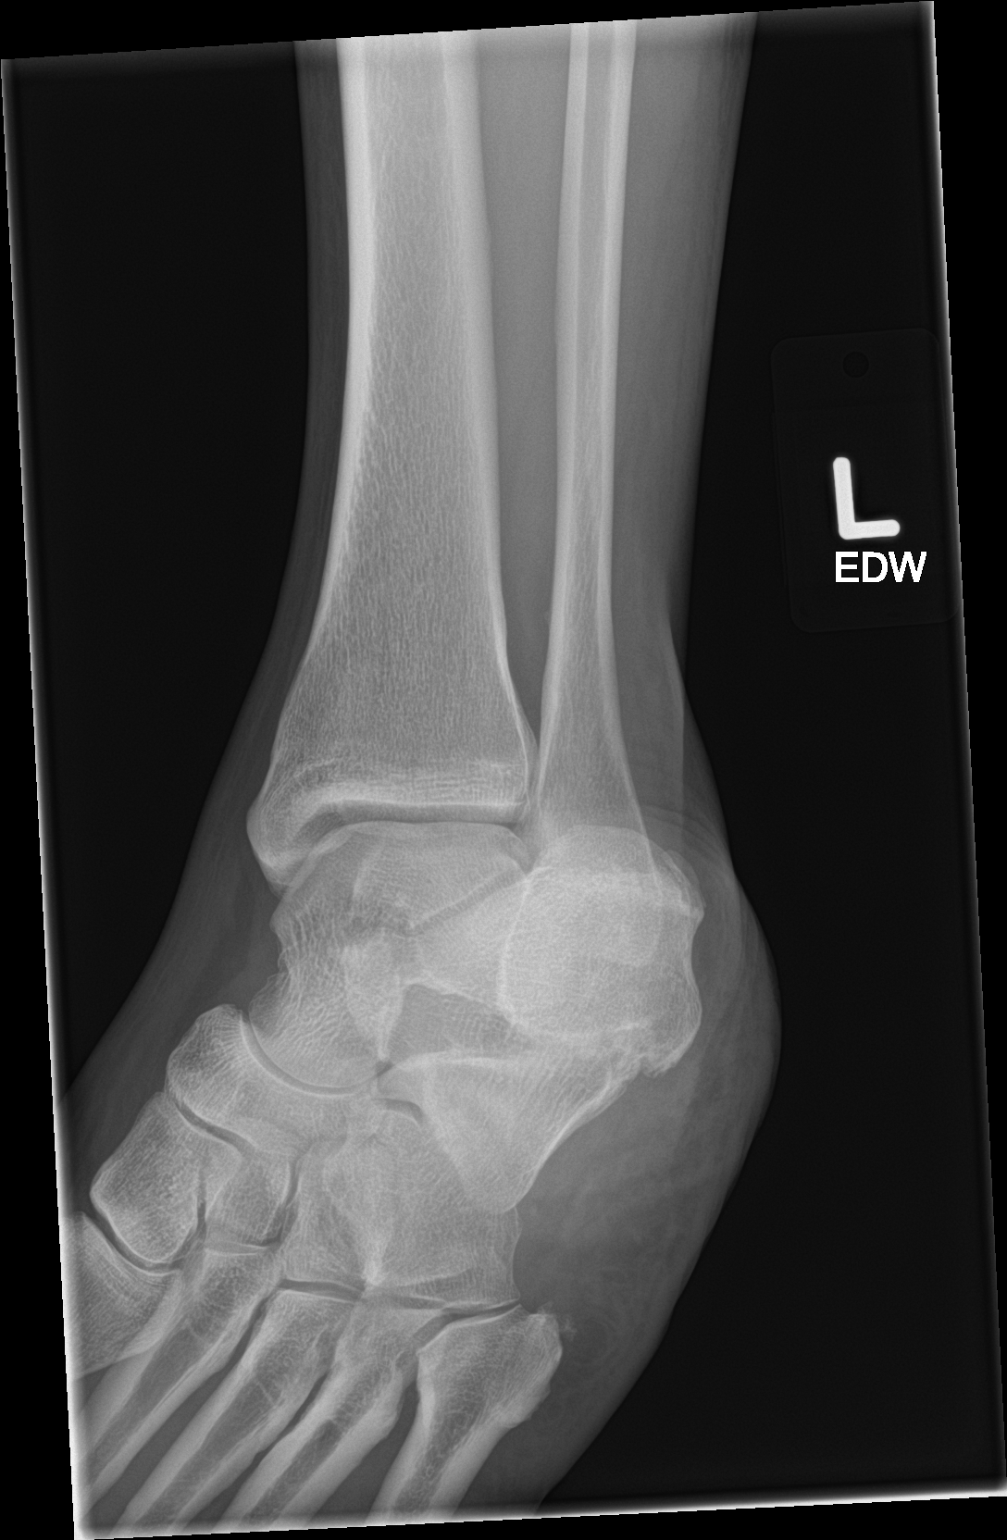

[ankle lat]
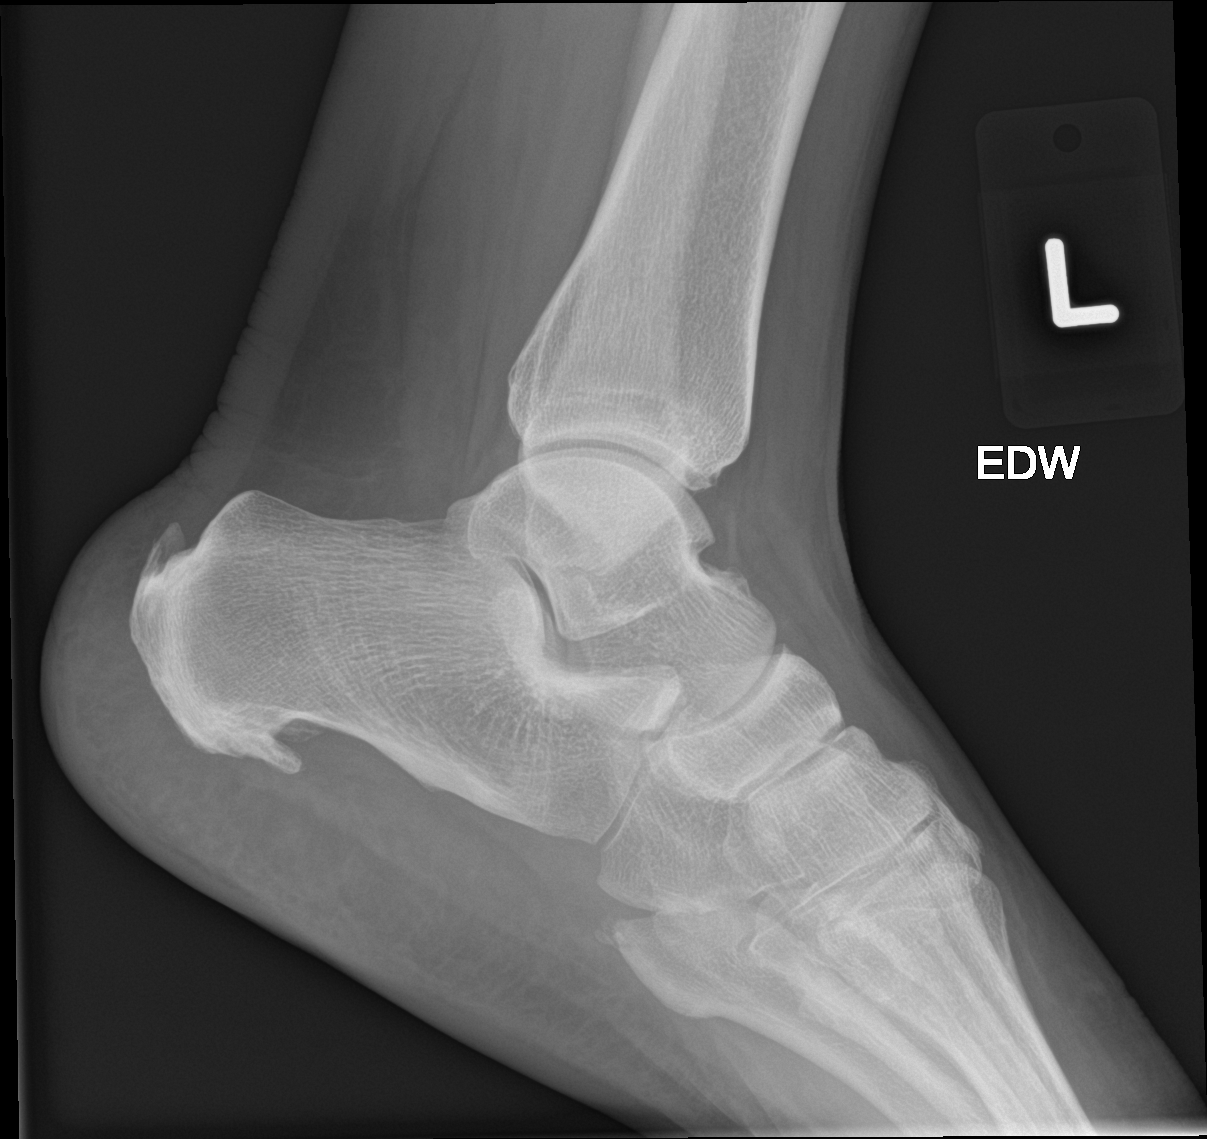

[3 of 3 positions shown; findings below may reference images not displayed]

FINDINGS: There is no evidence of fracture, dislocation, or joint effusion.
There is no evidence of arthropathy or other focal bone abnormality.
Soft tissues are unremarkable.
IMPRESSION: Negative.

## 2016-12-21 ENCOUNTER — Ambulatory Visit: Payer: Self-pay | Admitting: Internal Medicine

## 2016-12-29 ENCOUNTER — Telehealth: Payer: Self-pay | Admitting: Pharmacist

## 2016-12-29 NOTE — Telephone Encounter (Signed)
12/29/16 Called [employer] spoke with Jerrell Belfast to check on order from 10/03/16-they shipped to patient 10/14/16 and placed a refill today-will ship to patient. Patient has 2 refills left, and enrollment till 08/18/17.Delos Haring

## 2017-01-12 ENCOUNTER — Telehealth: Payer: Self-pay | Admitting: Pharmacist

## 2017-01-12 NOTE — Telephone Encounter (Signed)
01/12/17 Faxed Novo Nordisk refill request for [employer] Inject 50 units under the skin in the AM & 50 units under the skin in the PM, #12-Max daily dose 100 units.  Novolog Vials Inject 13 units under the skin three times a day with meals, # 5-Max daily dose 39 units.Cynthia Harper

## 2017-01-20 ENCOUNTER — Other Ambulatory Visit: Payer: Self-pay | Admitting: Internal Medicine

## 2017-01-20 ENCOUNTER — Other Ambulatory Visit: Payer: Self-pay | Admitting: Urology

## 2017-01-20 DIAGNOSIS — I10 Essential (primary) hypertension: Secondary | ICD-10-CM

## 2017-03-07 ENCOUNTER — Telehealth: Payer: Self-pay

## 2017-03-07 NOTE — Telephone Encounter (Signed)
Lm to call back to make a f/u appt

## 2017-03-13 ENCOUNTER — Other Ambulatory Visit: Payer: Self-pay | Admitting: Adult Health Nurse Practitioner

## 2017-03-14 ENCOUNTER — Telehealth: Payer: Self-pay | Admitting: Adult Health Nurse Practitioner

## 2017-03-14 NOTE — Telephone Encounter (Signed)
Returned our call twice. One at 12:00 and 1:53

## 2017-03-15 NOTE — Telephone Encounter (Signed)
Called pt and explained she needs an appt before meds can be refilled. Also has $16 no show fee and needs to update elig. Made financial counseling appt for tomorrow.

## 2017-03-16 ENCOUNTER — Ambulatory Visit: Payer: Self-pay

## 2017-03-27 ENCOUNTER — Telehealth: Payer: Self-pay | Admitting: Pharmacist

## 2017-03-27 NOTE — Telephone Encounter (Signed)
03/27/17 Placed refill online with [facility] for Ventolin HFA, to release 04/07/17, order# [id number].Delos Haring

## 2017-04-20 ENCOUNTER — Telehealth: Payer: Self-pay | Admitting: Pharmacist

## 2017-04-20 NOTE — Telephone Encounter (Signed)
04/20/2017 9:24:36 AM - Levemir & Novolog vials refills  04/20/17 Sending Novo Nordisk refill request to [facility] for provider to sign for Levemir Vials Inject 50 units in the AM & 50 units in the PM #12 (Max daily dose 100 units) & Novolog Vials Inject 13 units into the skin 3 times a day with meals #5 (max daily dose 39 units).Cynthia Harper

## 2017-04-28 ENCOUNTER — Telehealth: Payer: Self-pay | Admitting: Pharmacist

## 2017-04-28 NOTE — Telephone Encounter (Signed)
04/28/2017 1:27:32 PM - refills-Novolog & Levemir Vials  04/28/17 Faxed Novo Nordisk refill request for [employer] Inject 13 unit three times a day with meals #5, max daily dose. 39 units. Levemir Vials Inject 50 units in the AM & 50 units in the PM #12, max daily dose 100 units.Delos Haring

## 2017-05-05 ENCOUNTER — Other Ambulatory Visit: Payer: Self-pay | Admitting: Internal Medicine

## 2017-05-22 ENCOUNTER — Telehealth: Payer: Self-pay | Admitting: Pharmacist

## 2017-05-22 NOTE — Telephone Encounter (Signed)
05/22/2017 10:51:55 AM - Ventolin HFA refill  05/22/17 Placed refill online with [facility] for Ventolin HFA, to release 06/23/17, order# [id number].Delos Haring

## 2017-06-22 ENCOUNTER — Telehealth: Payer: Self-pay | Admitting: Pharmacist

## 2017-06-22 NOTE — Telephone Encounter (Signed)
06/22/2017 11:40:54 AM - Cynthia Harper refill call to [employer]  06/22/17 Called [employer] to see when Cynthia Harper was last shipped, they shipped to patient 12/29/16. I was able to place a refill today, they will ship 06/23/17 by [employer], allow 2-5 days to receive from 06/23/17 per [facility]. This is last refill-enrollment ends 08/18/17.Delos Haring

## 2017-06-23 ENCOUNTER — Other Ambulatory Visit: Payer: Self-pay

## 2017-06-23 DIAGNOSIS — I10 Essential (primary) hypertension: Secondary | ICD-10-CM

## 2017-06-23 MED ORDER — LISINOPRIL 20 MG PO TABS: ORAL_TABLET | ORAL | 0 refills | Status: DC

## 2017-06-29 ENCOUNTER — Ambulatory Visit: Payer: Self-pay | Admitting: Adult Health Nurse Practitioner

## 2017-06-29 VITALS — BP 179/95 | HR 76 | Temp 97.9°F | Ht 60.0 in | Wt 200.6 lb

## 2017-06-29 DIAGNOSIS — I10 Essential (primary) hypertension: Secondary | ICD-10-CM

## 2017-06-29 DIAGNOSIS — E119 Type 2 diabetes mellitus without complications: Secondary | ICD-10-CM

## 2017-06-29 DIAGNOSIS — E785 Hyperlipidemia, unspecified: Secondary | ICD-10-CM

## 2017-06-29 MED ORDER — HYDROCHLOROTHIAZIDE 25 MG PO TABS: 25.0000 mg | ORAL_TABLET | Freq: Every day | ORAL | 1 refills | Status: AC

## 2017-06-29 MED ORDER — AMLODIPINE BESYLATE 5 MG PO TABS: 5.0000 mg | ORAL_TABLET | Freq: Every day | ORAL | 1 refills | Status: AC

## 2017-06-29 MED ORDER — ROSUVASTATIN CALCIUM 10 MG PO TABS: 10.0000 mg | ORAL_TABLET | Freq: Every day | ORAL | 1 refills | Status: AC

## 2017-06-29 MED ORDER — INSULIN ASPART 100 UNIT/ML FLEXPEN: 13.0000 [IU] | PEN_INJECTOR | Freq: Three times a day (TID) | SUBCUTANEOUS | 11 refills | Status: AC

## 2017-06-29 MED ORDER — METFORMIN HCL 500 MG PO TABS: ORAL_TABLET | ORAL | 0 refills | Status: AC

## 2017-06-29 MED ORDER — LISINOPRIL 20 MG PO TABS: ORAL_TABLET | ORAL | 3 refills | Status: AC

## 2017-06-29 MED ORDER — INSULIN DETEMIR 100 UNIT/ML ~~LOC~~ SOLN: 50.0000 [IU] | Freq: Two times a day (BID) | SUBCUTANEOUS | 5 refills | Status: AC

## 2017-06-29 NOTE — Progress Notes (Signed)
Subjective:    Patient ID: Cynthia Harper, female    DOB: 24-Mar-1960, 57 y.o.   MRN: [id number]  HPI  Cynthia Harper is a 57 yo F here for HTN and Diabetes. BP is elevated at 179/95. Pt reports she has been out of Amlodipine 5mg . She took Lisinopril 20mg  and HCTZ today.  DM: pt self-reports CBG have been fairly normal. She reports at night her CBG has been dropping to 50 3 times/week - she works 3rd shift. She is resumed taking 50 units of insulin BID due to elevating CBG She endorses checking her feet.    Patient Active Problem List   Diagnosis Date Noted  . Left hip pain 05/31/2016  . Hyperlipidemia 03/31/2015  . Pelvic mass 03/31/2015  . Diabetes mellitus without complication ([place]) 65/46/5035  . Essential hypertension 03/10/2015  . Back pain 03/10/2015   Allergies as of 06/29/2017   No Known Allergies     Medication List        Accurate as of 06/29/17  6:53 PM. Always use your most recent med list.          albuterol 108 (90 Base) MCG/ACT inhaler Commonly known as:  PROVENTIL HFA;VENTOLIN HFA Inhale 2 puffs into the lungs every 6 (six) hours as needed for wheezing or shortness of breath.   amLODipine 5 MG tablet Commonly known as:  NORVASC TAKE ONE TABLET BY MOUTH EVERY DAY   aspirin 81 MG tablet Take 81 mg by mouth daily.   cyclobenzaprine 10 MG tablet Commonly known as:  FLEXERIL Take 1 tablet (10 mg total) by mouth 3 (three) times daily.   gabapentin 400 MG capsule Commonly known as:  NEURONTIN TAKE ONE CAPSULE BY MOUTH 3 TIMES A DAY   hydrochlorothiazide 25 MG tablet Commonly known as:  HYDRODIURIL TAKE ONE TABLET BY MOUTH EVERY DAY   ibuprofen 600 MG tablet Commonly known as:  ADVIL,MOTRIN Take 600 mg by mouth every 6 (six) hours as needed.   insulin aspart 100 UNIT/ML FlexPen Commonly known as:  NOVOLOG FLEXPEN Inject 13 Units into the skin 3 (three) times daily with meals.   insulin detemir 100 UNIT/ML injection Commonly known as:  LEVEMIR Inject 50  Units into the skin 2 (two) times daily.   linagliptin 5 MG Tabs tablet Commonly known as:  TRADJENTA Take 1 tablet (5 mg total) by mouth daily.   lisinopril 20 MG tablet Commonly known as:  PRINIVIL,ZESTRIL TAKE ONE TABLET BY MOUTH 2 TIMES A DAY   meloxicam 15 MG tablet Commonly known as:  MOBIC Take 1 tablet (15 mg total) by mouth daily as needed.   metFORMIN 500 MG tablet Commonly known as:  GLUCOPHAGE TAKE ONE TABLET BY MOUTH 2 TIMES A DAY WITH A MEAL.   mometasone 0.1 % cream Commonly known as:  ELOCON Apply 1 application topically daily.   naproxen 250 MG tablet Commonly known as:  NAPROSYN Take 1 tablet (250 mg total) by mouth 2 (two) times daily with a meal.   rosuvastatin 10 MG tablet Commonly known as:  CRESTOR Take 1 tablet (10 mg total) by mouth daily.        Review of Systems  All other systems reviewed and are negative.      Objective:   Physical Exam  Constitutional: She is oriented to person, place, and time. She appears well-developed and well-nourished.  Cardiovascular: Normal rate, regular rhythm, normal heart sounds and intact distal pulses.  Pulmonary/Chest: Effort normal and breath sounds normal.  Abdominal: Soft. Bowel sounds are normal.  Musculoskeletal: She exhibits no edema.  Neurological: She is alert and oriented to person, place, and time.  Vitals reviewed.   BP (!) 179/95   Pulse 76   Temp 97.9 F (36.6 C)   Ht 5' (1.524 m)   Wt 200 lb 9.6 oz (91 kg)   BMI 39.18 kg/m        Assessment & Plan:   Refilled all meds. Labs tonight.    DM: Educated pt on diet to maintain steady CBG throughout the day.  A1c tonight.  Check feet, do not walk barefoot.   HTN: Not controlled.  Refilled meds.  Discussed stroke risk.   Encouraged medication and visit compliance.   F/u in 4 weeks for BP check.

## 2017-06-30 LAB — HEMOGLOBIN A1C
ESTIMATED AVERAGE GLUCOSE: 252 mg/dL
HEMOGLOBIN A1C: 10.4 % — AB (ref 4.8–5.6)

## 2017-06-30 LAB — LIPID PANEL
CHOL/HDL RATIO: 6.4 ratio — AB (ref 0.0–4.4)
CHOLESTEROL TOTAL: 322 mg/dL — AB (ref 100–199)
HDL: 50 mg/dL (ref >39)
TRIGLYCERIDES: 418 mg/dL — AB (ref 0–149)

## 2017-06-30 LAB — COMPREHENSIVE METABOLIC PANEL
ALBUMIN: 4.1 g/dL (ref 3.5–5.5)
ALK PHOS: 74 IU/L (ref 39–117)
ALT: 22 IU/L (ref 0–32)
AST: 18 IU/L (ref 0–40)
Albumin/Globulin Ratio: 1.6 (ref 1.2–2.2)
BUN / CREAT RATIO: 23 (ref 9–23)
BUN: 25 mg/dL — AB (ref 6–24)
CO2: 25 mmol/L (ref 20–29)
CREATININE: 1.09 mg/dL — AB (ref 0.57–1.00)
Calcium: 10.5 mg/dL — ABNORMAL HIGH (ref 8.7–10.2)
Chloride: 103 mmol/L (ref 96–106)
GFR calc Af Amer: 67 mL/min/{1.73_m2} (ref >59)
GFR calc non Af Amer: 58 mL/min/{1.73_m2} — ABNORMAL LOW (ref >59)
GLUCOSE: 76 mg/dL (ref 65–99)
Globulin, Total: 2.5 g/dL (ref 1.5–4.5)
Potassium: 4.1 mmol/L (ref 3.5–5.2)
Sodium: 146 mmol/L — ABNORMAL HIGH (ref 134–144)
Total Protein: 6.6 g/dL (ref 6.0–8.5)

## 2017-07-17 ENCOUNTER — Telehealth: Payer: Self-pay | Admitting: Pharmacist

## 2017-07-17 NOTE — Telephone Encounter (Signed)
07/17/2017 10:50:41 AM - [facility] renewal  07/17/17 Patient enrollment for Tradjenta expires 08/18/17 Printed [employer] application-mailing patient her portion to sign & return, also sending provider portion to [facility] for Dr. Mable Fill to sign for patient renewal.AJ

## 2017-08-01 ENCOUNTER — Telehealth: Payer: Self-pay | Admitting: Pharmacist

## 2017-08-01 ENCOUNTER — Ambulatory Visit: Payer: Self-pay

## 2017-08-01 NOTE — Telephone Encounter (Signed)
08/01/2017 4:27:13 PM - refills-Levemir,Novolog Flexpen & tips  08/01/17 Printed [employer] renewal application for [employer] Vials-Inject 50 units in the morning & 50 units in the evening-Max daily dose 100 units #12; Novolog Flexpen Inject 13 units into the skin 3 times a day with meals-Max daily dose 39 units # 4; Novofine 32G tips to use daily with Flexpen #3. Mailing patient her portion to sign & return with 1 current month of check stubs, and taking to [facility] for provider to sign.Delos Haring

## 2017-08-07 ENCOUNTER — Telehealth: Payer: Self-pay | Admitting: Pharmacist

## 2017-08-07 NOTE — Telephone Encounter (Signed)
08/07/2017 10:44:31 AM - Cynthia Harper pending  08/07/17 I have received the provider portion of [employer] application fro [place] back signed, holding for patient to return her portion-mailed to patient 07/17/17.Cynthia Harper

## 2017-08-15 ENCOUNTER — Telehealth: Payer: Self-pay | Admitting: Pharmacist

## 2017-08-15 NOTE — Telephone Encounter (Signed)
08/15/2017 8:28:46 AM - Levemir, Novolog pens & tips pending  08/15/17 I have received the provider portion of [employer] application & scripts back for Levemir Vials, Novolog Flexpen & tips. Holding now for patient to return her portion that was mailed to her on 08/01/17.Delos Haring

## 2017-09-04 ENCOUNTER — Other Ambulatory Visit: Payer: Self-pay | Admitting: Internal Medicine

## 2017-09-06 ENCOUNTER — Telehealth: Payer: Self-pay | Admitting: Pharmacist

## 2017-09-06 NOTE — Telephone Encounter (Signed)
09/06/2017 8:50:53 AM - Ventolin [employer] renewal  09/06/17 Received notice from [place] that enrollment ends 10/04/17-I have printed renewal application & script--I will take script to [facility] for Ventolin HFA 84mcg Inhale 2 puffs into the lungs every 6 hours as needed & I have put patient's portion to sign in a bag of medications on the wall.Cynthia Harper

## 2017-10-03 ENCOUNTER — Telehealth: Payer: Self-pay | Admitting: Pharmacy Technician

## 2017-10-03 NOTE — Telephone Encounter (Signed)
Patient has prescription drug coverage with [employer].  Patient no longer meets the eligibility criteria to receive medication assistance from [facility].  Patient notified by letter.  [facility]

## 2017-12-25 ENCOUNTER — Emergency Department: Payer: Managed Care, Other (non HMO)

## 2017-12-25 ENCOUNTER — Encounter: Payer: Self-pay | Admitting: Emergency Medicine

## 2017-12-25 ENCOUNTER — Emergency Department
Admission: EM | Admit: 2017-12-25 | Discharge: 2017-12-25 | Disposition: A | Discharge status: Home / Self Care | Payer: Managed Care, Other (non HMO) | Attending: Emergency Medicine | Admitting: Emergency Medicine

## 2017-12-25 DIAGNOSIS — Y9389 Activity, other specified: Secondary | ICD-10-CM | POA: Insufficient documentation

## 2017-12-25 DIAGNOSIS — I10 Essential (primary) hypertension: Secondary | ICD-10-CM | POA: Diagnosis not present

## 2017-12-25 DIAGNOSIS — Y999 Unspecified external cause status: Secondary | ICD-10-CM | POA: Insufficient documentation

## 2017-12-25 DIAGNOSIS — E119 Type 2 diabetes mellitus without complications: Secondary | ICD-10-CM | POA: Diagnosis not present

## 2017-12-25 DIAGNOSIS — Z794 Long term (current) use of insulin: Secondary | ICD-10-CM | POA: Insufficient documentation

## 2017-12-25 DIAGNOSIS — Z7982 Long term (current) use of aspirin: Secondary | ICD-10-CM | POA: Insufficient documentation

## 2017-12-25 DIAGNOSIS — Z79899 Other long term (current) drug therapy: Secondary | ICD-10-CM | POA: Diagnosis not present

## 2017-12-25 DIAGNOSIS — S8992XA Unspecified injury of left lower leg, initial encounter: Secondary | ICD-10-CM | POA: Diagnosis present

## 2017-12-25 DIAGNOSIS — M25562 Pain in left knee: Secondary | ICD-10-CM | POA: Diagnosis not present

## 2017-12-25 DIAGNOSIS — Y92410 Unspecified street and highway as the place of occurrence of the external cause: Secondary | ICD-10-CM | POA: Diagnosis not present

## 2017-12-25 MED ORDER — NAPROXEN 500 MG PO TABS: 500.0000 mg | ORAL_TABLET | Freq: Two times a day (BID) | ORAL | 0 refills | Status: AC
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
## 2018-10-17 ENCOUNTER — Other Ambulatory Visit: Payer: Self-pay

## 2019-04-28 ENCOUNTER — Other Ambulatory Visit: Payer: Self-pay

## 2019-04-28 ENCOUNTER — Emergency Department
Admission: EM | Admit: 2019-04-28 | Discharge: 2019-04-28 | Disposition: A | Discharge status: Home / Self Care | Payer: 59 | Attending: Emergency Medicine | Admitting: Emergency Medicine

## 2019-04-28 ENCOUNTER — Emergency Department: Payer: 59

## 2019-04-28 DIAGNOSIS — R0789 Other chest pain: Secondary | ICD-10-CM | POA: Insufficient documentation

## 2019-04-28 DIAGNOSIS — Y999 Unspecified external cause status: Secondary | ICD-10-CM | POA: Insufficient documentation

## 2019-04-28 DIAGNOSIS — Z7982 Long term (current) use of aspirin: Secondary | ICD-10-CM | POA: Insufficient documentation

## 2019-04-28 DIAGNOSIS — M7989 Other specified soft tissue disorders: Secondary | ICD-10-CM | POA: Insufficient documentation

## 2019-04-28 DIAGNOSIS — F419 Anxiety disorder, unspecified: Secondary | ICD-10-CM | POA: Diagnosis not present

## 2019-04-28 DIAGNOSIS — Y9389 Activity, other specified: Secondary | ICD-10-CM | POA: Diagnosis not present

## 2019-04-28 DIAGNOSIS — Y92410 Unspecified street and highway as the place of occurrence of the external cause: Secondary | ICD-10-CM | POA: Diagnosis not present

## 2019-04-28 DIAGNOSIS — M7918 Myalgia, other site: Secondary | ICD-10-CM

## 2019-04-28 DIAGNOSIS — M25561 Pain in right knee: Secondary | ICD-10-CM | POA: Insufficient documentation

## 2019-04-28 DIAGNOSIS — E119 Type 2 diabetes mellitus without complications: Secondary | ICD-10-CM | POA: Insufficient documentation

## 2019-04-28 DIAGNOSIS — Z79899 Other long term (current) drug therapy: Secondary | ICD-10-CM | POA: Insufficient documentation

## 2019-04-28 DIAGNOSIS — I10 Essential (primary) hypertension: Secondary | ICD-10-CM | POA: Insufficient documentation

## 2019-04-28 DIAGNOSIS — Z794 Long term (current) use of insulin: Secondary | ICD-10-CM | POA: Insufficient documentation

## 2019-04-28 DIAGNOSIS — R0781 Pleurodynia: Secondary | ICD-10-CM | POA: Insufficient documentation

## 2019-04-28 MED ORDER — OXYCODONE-ACETAMINOPHEN 5-325 MG PO TABS
2.0000 | ORAL_TABLET | Freq: Once | ORAL | Status: AC
  Administered 2019-04-28: 19:00:00 2 via ORAL
  Filled 2019-04-28: qty 2

## 2019-04-28 MED ORDER — OXYCODONE-ACETAMINOPHEN 5-325 MG PO TABS: 1.0000 | ORAL_TABLET | Freq: Four times a day (QID) | ORAL | 0 refills | Status: AC | PRN

## 2019-04-28 MED ORDER — ONDANSETRON 4 MG PO TBDP: 4.0000 mg | ORAL_TABLET | Freq: Three times a day (TID) | ORAL | 0 refills | Status: AC | PRN
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
## 2019-04-28 NOTE — Discharge Instructions (Signed)
Take over-the-counter ibuprofen (if able) for moderate pain  Take the oxycodone for severe pain  No heavy lifting or exercise for the next week  Return to ER with any worsening pain, nausea, vomiting, SOB, or other concerning symptoms

## 2023-04-22 ENCOUNTER — Other Ambulatory Visit
Admission: RE | Admit: 2023-04-22 | Discharge: 2023-04-22 | Disposition: A | Discharge status: Home / Self Care | Payer: Self-pay | Source: Ambulatory Visit | Attending: Family Medicine | Admitting: Family Medicine

## 2023-04-22 DIAGNOSIS — Z8639 Personal history of other endocrine, nutritional and metabolic disease: Secondary | ICD-10-CM | POA: Insufficient documentation

## 2023-04-22 DIAGNOSIS — M25522 Pain in left elbow: Secondary | ICD-10-CM | POA: Insufficient documentation

## 2023-04-22 DIAGNOSIS — R7989 Other specified abnormal findings of blood chemistry: Secondary | ICD-10-CM | POA: Insufficient documentation

## 2023-04-22 DIAGNOSIS — Z026 Encounter for examination for insurance purposes: Secondary | ICD-10-CM | POA: Insufficient documentation

## 2023-04-22 DIAGNOSIS — S59902A Unspecified injury of left elbow, initial encounter: Secondary | ICD-10-CM | POA: Insufficient documentation

## 2023-04-22 LAB — BASIC METABOLIC PANEL WITH GFR
Anion gap: 8 (ref 5–15)
BUN: 28 mg/dL — ABNORMAL HIGH (ref 6–20)
CO2: 29 mmol/L (ref 22–32)
Calcium: 10.3 mg/dL (ref 8.9–10.3)
Chloride: 98 mmol/L (ref 98–111)
Creatinine, Ser: 1.66 mg/dL — ABNORMAL HIGH (ref 0.44–1.00)
GFR, Estimated: 35 mL/min — ABNORMAL LOW (ref >60)
Glucose, Bld: 589 mg/dL (ref 70–99)
Potassium: 4.3 mmol/L (ref 3.5–5.1)
Sodium: 135 mmol/L (ref 135–145)
